# Patient Record
Sex: Female | Born: 1970 | Race: Black or African American | Hispanic: No | Marital: Married | Smoking: Former smoker
Health system: Southern US, Community
[De-identification: ages and names within clinical notes are randomized; demographics above are authoritative.]

## PROBLEM LIST (undated history)

## (undated) DIAGNOSIS — R079 Chest pain, unspecified: Secondary | ICD-10-CM

## (undated) DIAGNOSIS — U071 COVID-19: Secondary | ICD-10-CM

## (undated) DIAGNOSIS — E7800 Pure hypercholesterolemia, unspecified: Secondary | ICD-10-CM

## (undated) DIAGNOSIS — R42 Dizziness and giddiness: Secondary | ICD-10-CM

## (undated) DIAGNOSIS — D219 Benign neoplasm of connective and other soft tissue, unspecified: Secondary | ICD-10-CM

## (undated) DIAGNOSIS — F419 Anxiety disorder, unspecified: Secondary | ICD-10-CM

## (undated) HISTORY — PX: WRIST SURGERY: SHX841

## (undated) HISTORY — PX: TUBAL LIGATION: SHX77

## (undated) HISTORY — DX: COVID-19: U07.1

## (undated) HISTORY — DX: Dizziness and giddiness: R42

## (undated) HISTORY — PX: ABDOMINAL HYSTERECTOMY: SHX81

## (undated) HISTORY — DX: Chest pain, unspecified: R07.9

---

## 2007-10-08 ENCOUNTER — Emergency Department (HOSPITAL_COMMUNITY): Admission: EM | Admit: 2007-10-08 | Discharge: 2007-10-08 | Payer: Self-pay | Admitting: Emergency Medicine

## 2007-12-18 ENCOUNTER — Emergency Department (HOSPITAL_COMMUNITY): Admission: EM | Admit: 2007-12-18 | Discharge: 2007-12-18 | Payer: Self-pay | Admitting: Emergency Medicine

## 2008-03-02 ENCOUNTER — Emergency Department (HOSPITAL_COMMUNITY): Admission: EM | Admit: 2008-03-02 | Discharge: 2008-03-02 | Payer: Self-pay | Admitting: Emergency Medicine

## 2008-12-12 ENCOUNTER — Emergency Department (HOSPITAL_COMMUNITY): Admission: EM | Admit: 2008-12-12 | Discharge: 2008-12-12 | Payer: Self-pay | Admitting: Emergency Medicine

## 2009-10-18 ENCOUNTER — Emergency Department (HOSPITAL_BASED_OUTPATIENT_CLINIC_OR_DEPARTMENT_OTHER): Admission: EM | Admit: 2009-10-18 | Discharge: 2009-10-18 | Payer: Self-pay | Admitting: Emergency Medicine

## 2009-11-20 ENCOUNTER — Emergency Department (HOSPITAL_BASED_OUTPATIENT_CLINIC_OR_DEPARTMENT_OTHER): Admission: EM | Admit: 2009-11-20 | Discharge: 2009-11-20 | Payer: Self-pay | Admitting: Emergency Medicine

## 2010-01-23 ENCOUNTER — Ambulatory Visit: Payer: Self-pay | Admitting: Diagnostic Radiology

## 2010-01-23 ENCOUNTER — Emergency Department (HOSPITAL_BASED_OUTPATIENT_CLINIC_OR_DEPARTMENT_OTHER): Admission: EM | Admit: 2010-01-23 | Discharge: 2010-01-23 | Payer: Self-pay | Admitting: Emergency Medicine

## 2010-06-07 LAB — URINE MICROSCOPIC-ADD ON

## 2010-06-07 LAB — DIFFERENTIAL
Basophils Absolute: 0.2 10*3/uL — ABNORMAL HIGH (ref 0.0–0.1)
Basophils Relative: 2 % — ABNORMAL HIGH (ref 0–1)
Eosinophils Absolute: 0.1 10*3/uL (ref 0.0–0.7)
Eosinophils Relative: 1 % (ref 0–5)
Lymphs Abs: 2 10*3/uL (ref 0.7–4.0)

## 2010-06-07 LAB — BASIC METABOLIC PANEL
BUN: 10 mg/dL (ref 6–23)
CO2: 26 mEq/L (ref 19–32)
Calcium: 9.5 mg/dL (ref 8.4–10.5)
Chloride: 106 mEq/L (ref 96–112)
Creatinine, Ser: 0.6 mg/dL (ref 0.4–1.2)
Glucose, Bld: 72 mg/dL (ref 70–99)

## 2010-06-07 LAB — CBC
MCH: 27.7 pg (ref 26.0–34.0)
MCHC: 33.5 g/dL (ref 30.0–36.0)
MCV: 82.6 fL (ref 78.0–100.0)
Platelets: 283 10*3/uL (ref 150–400)
RDW: 13.8 % (ref 11.5–15.5)

## 2010-06-07 LAB — URINALYSIS, ROUTINE W REFLEX MICROSCOPIC
Bilirubin Urine: NEGATIVE
Ketones, ur: NEGATIVE mg/dL
Nitrite: NEGATIVE
Protein, ur: NEGATIVE mg/dL
pH: 7 (ref 5.0–8.0)

## 2010-06-07 LAB — URINE CULTURE
Colony Count: NO GROWTH
Culture  Setup Time: 201111010736
Culture: NO GROWTH

## 2010-06-07 LAB — PREGNANCY, URINE: Preg Test, Ur: NEGATIVE

## 2010-06-30 LAB — URINALYSIS, ROUTINE W REFLEX MICROSCOPIC
Glucose, UA: NEGATIVE mg/dL
Hgb urine dipstick: NEGATIVE
Ketones, ur: NEGATIVE mg/dL
Protein, ur: NEGATIVE mg/dL

## 2010-06-30 LAB — URINE MICROSCOPIC-ADD ON

## 2010-06-30 LAB — PREGNANCY, URINE: Preg Test, Ur: NEGATIVE

## 2010-12-22 LAB — COMPREHENSIVE METABOLIC PANEL
ALT: 14
Alkaline Phosphatase: 74
CO2: 28
GFR calc non Af Amer: >60
Glucose, Bld: 90
Potassium: 4.1
Sodium: 138

## 2010-12-22 LAB — CBC
Hemoglobin: 13.5
RBC: 4.74

## 2010-12-22 LAB — DIFFERENTIAL
Basophils Relative: 1
Eosinophils Absolute: 0.1
Monocytes Relative: 8
Neutrophils Relative %: 63

## 2010-12-22 LAB — URINALYSIS, ROUTINE W REFLEX MICROSCOPIC
Hgb urine dipstick: NEGATIVE
Specific Gravity, Urine: 1.021
Urobilinogen, UA: 1

## 2010-12-22 LAB — LIPASE, BLOOD: Lipase: 19

## 2010-12-22 LAB — WET PREP, GENITAL: Yeast Wet Prep HPF POC: NONE SEEN

## 2010-12-22 LAB — PREGNANCY, URINE: Preg Test, Ur: NEGATIVE

## 2010-12-22 LAB — GC/CHLAMYDIA PROBE AMP, GENITAL: GC Probe Amp, Genital: NEGATIVE

## 2010-12-25 LAB — URINALYSIS, ROUTINE W REFLEX MICROSCOPIC
Ketones, ur: NEGATIVE
Nitrite: NEGATIVE
Protein, ur: NEGATIVE
Urobilinogen, UA: 0.2
pH: 7

## 2010-12-25 LAB — URINE CULTURE
Colony Count: NO GROWTH
Culture: NO GROWTH

## 2010-12-25 LAB — URINE MICROSCOPIC-ADD ON

## 2010-12-25 LAB — WET PREP, GENITAL
Trich, Wet Prep: NONE SEEN
Yeast Wet Prep HPF POC: NONE SEEN

## 2010-12-29 LAB — RAPID STREP SCREEN (MED CTR MEBANE ONLY): Streptococcus, Group A Screen (Direct): NEGATIVE

## 2011-07-07 ENCOUNTER — Encounter (HOSPITAL_BASED_OUTPATIENT_CLINIC_OR_DEPARTMENT_OTHER): Payer: Self-pay | Admitting: *Deleted

## 2011-07-07 ENCOUNTER — Emergency Department (HOSPITAL_BASED_OUTPATIENT_CLINIC_OR_DEPARTMENT_OTHER)
Admission: EM | Admit: 2011-07-07 | Discharge: 2011-07-07 | Disposition: A | Discharge status: Home / Self Care | Payer: Medicaid Other | Attending: Emergency Medicine | Admitting: Emergency Medicine

## 2011-07-07 DIAGNOSIS — F17200 Nicotine dependence, unspecified, uncomplicated: Secondary | ICD-10-CM | POA: Insufficient documentation

## 2011-07-07 DIAGNOSIS — R10819 Abdominal tenderness, unspecified site: Secondary | ICD-10-CM | POA: Insufficient documentation

## 2011-07-07 DIAGNOSIS — R110 Nausea: Secondary | ICD-10-CM | POA: Insufficient documentation

## 2011-07-07 DIAGNOSIS — R109 Unspecified abdominal pain: Secondary | ICD-10-CM | POA: Insufficient documentation

## 2011-07-07 DIAGNOSIS — K219 Gastro-esophageal reflux disease without esophagitis: Secondary | ICD-10-CM | POA: Insufficient documentation

## 2011-07-07 DIAGNOSIS — E7800 Pure hypercholesterolemia, unspecified: Secondary | ICD-10-CM | POA: Insufficient documentation

## 2011-07-07 HISTORY — DX: Pure hypercholesterolemia, unspecified: E78.00

## 2011-07-07 LAB — DIFFERENTIAL
Lymphocytes Relative: 22 % (ref 12–46)
Monocytes Absolute: 0.6 10*3/uL (ref 0.1–1.0)
Monocytes Relative: 7 % (ref 3–12)
Neutro Abs: 6.3 10*3/uL (ref 1.7–7.7)

## 2011-07-07 LAB — URINALYSIS, ROUTINE W REFLEX MICROSCOPIC
Bilirubin Urine: NEGATIVE
Hgb urine dipstick: NEGATIVE
Ketones, ur: NEGATIVE mg/dL
Protein, ur: NEGATIVE mg/dL
Urobilinogen, UA: 0.2 mg/dL (ref 0.0–1.0)

## 2011-07-07 LAB — COMPREHENSIVE METABOLIC PANEL
AST: 13 U/L (ref 0–37)
Alkaline Phosphatase: 79 U/L (ref 39–117)
BUN: 7 mg/dL (ref 6–23)
CO2: 26 mEq/L (ref 19–32)
Chloride: 101 mEq/L (ref 96–112)
Creatinine, Ser: 0.6 mg/dL (ref 0.50–1.10)
GFR calc non Af Amer: >90 mL/min (ref >90)
Total Bilirubin: 0.6 mg/dL (ref 0.3–1.2)

## 2011-07-07 LAB — CBC
HCT: 39.8 % (ref 36.0–46.0)
Hemoglobin: 13.9 g/dL (ref 12.0–15.0)
RBC: 5.07 MIL/uL (ref 3.87–5.11)
WBC: 8.9 10*3/uL (ref 4.0–10.5)

## 2011-07-07 LAB — URINE MICROSCOPIC-ADD ON

## 2011-07-07 LAB — LIPASE, BLOOD: Lipase: 16 U/L (ref 11–59)

## 2011-07-07 MED ORDER — RANITIDINE HCL 150 MG PO TABS: 150.0000 mg | ORAL_TABLET | Freq: Two times a day (BID) | ORAL | Status: DC

## 2011-07-07 MED ORDER — GI COCKTAIL ~~LOC~~: 30.0000 mL | Freq: Once | ORAL | Status: DC

## 2011-07-07 MED ORDER — GI COCKTAIL ~~LOC~~
ORAL | Status: AC
  Filled 2011-07-07: qty 30

## 2011-07-07 MED ORDER — ONDANSETRON HCL 4 MG/2ML IJ SOLN
4.0000 mg | Freq: Once | INTRAMUSCULAR | Status: AC
  Administered 2011-07-07: 4 mg via INTRAVENOUS
  Filled 2011-07-07: qty 2

## 2011-07-07 MED ORDER — MORPHINE SULFATE 4 MG/ML IJ SOLN
4.0000 mg | Freq: Once | INTRAMUSCULAR | Status: AC
  Administered 2011-07-07: 4 mg via INTRAVENOUS
  Filled 2011-07-07: qty 1

## 2011-07-07 NOTE — ED Provider Notes (Signed)
History     CSN: [id number]  Arrival date & time 07/07/11  1478   First MD Initiated Contact with Patient 07/07/11 1844      Chief Complaint  Patient presents with  . Abdominal Pain    (Consider location/radiation/quality/duration/timing/severity/associated sxs/prior treatment) HPI Comments: Pt states that she has had intermittent right upper and middle abdominal pain for the last 3 days:pt states that it is a burning:pt states that the symptoms seem to be worse with laying down and eating:pt denies fever:pt denies history of similar symptoms  Patient is a 41 y.o. female presenting with abdominal pain. The history is provided by the patient. No language interpreter was used.  Abdominal Pain The primary symptoms of the illness include abdominal pain and nausea. The primary symptoms of the illness do not include vomiting, diarrhea, dysuria or vaginal discharge. The current episode started more than 2 days ago. The onset of the illness was gradual. The problem has not changed since onset. The patient states that she believes she is currently not pregnant.    Past Medical History  Diagnosis Date  . Hypercholesteremia     Past Surgical History  Procedure Date  . Tubal ligation     History reviewed. No pertinent family history.  History  Substance Use Topics  . Smoking status: Current Everyday Smoker  . Smokeless tobacco: Not on file  . Alcohol Use: No    OB History    Grav Para Term Preterm Abortions TAB SAB Ect Mult Living                  Review of Systems  Constitutional: Negative.   HENT: Negative.   Respiratory: Negative.   Cardiovascular: Negative.   Gastrointestinal: Positive for nausea and abdominal pain. Negative for vomiting and diarrhea.  Genitourinary: Negative for dysuria and vaginal discharge.  Musculoskeletal: Negative.   Skin: Negative.   Neurological: Negative.     Allergies  Ibuprofen  Home Medications   Current Outpatient Rx  Name Route  Sig Dispense Refill  . LOVASTATIN PO Oral Take 1 tablet by mouth at bedtime. Patient doesn't know milligrams of medication.  I called the pharmacy but it is closed.    Marland Kitchen SOLIFENACIN SUCCINATE 5 MG PO TABS Oral Take 5 mg by mouth daily.      BP 125/80  Pulse 74  Temp(Src) 98 F (36.7 C) (Oral)  Resp 20  Ht 5\' 7"  (1.702 m)  Wt 200 lb (90.719 kg)  BMI 31.32 kg/m2  SpO2 100%  LMP 06/24/2011  Physical Exam  Nursing note and vitals reviewed. Constitutional: She is oriented to person, place, and time. She appears well-developed and well-nourished.  HENT:  Head: Normocephalic and atraumatic.  Eyes: EOM are normal.  Neck: Neck supple.  Cardiovascular: Normal rate and regular rhythm.   Pulmonary/Chest: Effort normal.  Abdominal: Soft. Bowel sounds are normal. There is tenderness in the right upper quadrant and epigastric area.  Musculoskeletal: Normal range of motion.  Neurological: She is alert and oriented to person, place, and time.  Skin: Skin is warm and dry.  Psychiatric: She has a normal mood and affect.    ED Course  Procedures (including critical care time)  Labs Reviewed  URINALYSIS, ROUTINE W REFLEX MICROSCOPIC - Abnormal; Notable for the following:    APPearance CLOUDY (*)    Leukocytes, UA SMALL (*)    All other components within normal limits  URINE MICROSCOPIC-ADD ON - Abnormal; Notable for the following:    Squamous Epithelial /  LPF FEW (*)    Bacteria, UA MANY (*)    All other components within normal limits  PREGNANCY, URINE  CBC  DIFFERENTIAL  COMPREHENSIVE METABOLIC PANEL  LIPASE, BLOOD  URINE CULTURE   No results found.   1. GERD (gastroesophageal reflux disease)       MDM   Pt symptoms resolved with gi cocktail:considered gallstones although unlikely:will treat pt for gerd       Teressa Lower, NP 07/07/11 2241

## 2011-07-07 NOTE — ED Notes (Signed)
Pt c/o mid upper abd pain, N/V/D since Wed.

## 2011-07-07 NOTE — ED Provider Notes (Signed)
Medical screening examination/treatment/procedure(s) were performed by non-physician practitioner and as supervising physician I was immediately available for consultation/collaboration.    Abubakr Wieman L Judge Duque, MD 07/07/11 2255 

## 2011-07-07 NOTE — Discharge Instructions (Signed)
Diet for GERD or PUD  Nutrition therapy can help ease the discomfort of gastroesophageal reflux disease (GERD) and peptic ulcer disease (PUD).   HOME CARE INSTRUCTIONS    Eat your meals slowly, in a relaxed setting.   Eat 5 to 6 small meals per day.   If a food causes distress, stop eating it for a period of time.  FOODS TO AVOID   Coffee, regular or decaffeinated.   Cola beverages, regular or low calorie.   Tea, regular or decaffeinated.   Pepper.   Cocoa.   High fat foods, including meats.   Butter, margarine, hydrogenated oil (trans fats).   Peppermint or spearmint (if you have GERD).   Fruits and vegetables if not tolerated.   Alcohol.   Nicotine (smoking or chewing). This is one of the most potent stimulants to acid production in the gastrointestinal tract.   Any food that seems to aggravate your condition.  If you have questions regarding your diet, ask your caregiver or a registered dietitian.  TIPS   Lying flat may make symptoms worse. Keep the head of your bed raised 6 to 9 inches (15 to 23 cm) by using a foam wedge or blocks under the legs of the bed.   Do not lay down until 3 hours after eating a meal.   Daily physical activity may help reduce symptoms.  MAKE SURE YOU:    Understand these instructions.   Will watch your condition.   Will get help right away if you are not doing well or get worse.  Document Released: 03/12/2005 Document Revised: 03/01/2011 Document Reviewed: 01/26/2011  ExitCare Patient Information 2012 [employer]    Gastroesophageal Reflux Disease, Adult  Gastroesophageal reflux disease (GERD) happens when acid from your stomach flows up into the esophagus. When acid comes in contact with the esophagus, the acid causes soreness (inflammation) in the esophagus. Over time, GERD may create small holes (ulcers) in the lining of the esophagus.  CAUSES    Increased body weight. This puts pressure on the stomach, making acid rise from the stomach into the  esophagus.   Smoking. This increases acid production in the stomach.   Drinking alcohol. This causes decreased pressure in the lower esophageal sphincter (valve or ring of muscle between the esophagus and stomach), allowing acid from the stomach into the esophagus.   Late evening meals and a full stomach. This increases pressure and acid production in the stomach.   A malformed lower esophageal sphincter.  Sometimes, no cause is found.  SYMPTOMS    Burning pain in the lower part of the mid-chest behind the breastbone and in the mid-stomach area. This may occur twice a week or more often.   Trouble swallowing.   Sore throat.   Dry cough.   Asthma-like symptoms including chest tightness, shortness of breath, or wheezing.  DIAGNOSIS   Your caregiver may be able to diagnose GERD based on your symptoms. In some cases, X-rays and other tests may be done to check for complications or to check the condition of your stomach and esophagus.  TREATMENT   Your caregiver may recommend over-the-counter or prescription medicines to help decrease acid production. Ask your caregiver before starting or adding any new medicines.   HOME CARE INSTRUCTIONS    Change the factors that you can control. Ask your caregiver for guidance concerning weight loss, quitting smoking, and alcohol consumption.   Avoid foods and drinks that make your symptoms worse, such as:   Caffeine or   alcoholic drinks.   Chocolate.   Peppermint or mint flavorings.   Garlic and onions.   Spicy foods.   Citrus fruits, such as oranges, lemons, or limes.   Tomato-based foods such as sauce, chili, salsa, and pizza.   Fried and fatty foods.   Avoid lying down for the 3 hours prior to your bedtime or prior to taking a nap.   Eat small, frequent meals instead of large meals.   Wear loose-fitting clothing. Do not wear anything tight around your waist that causes pressure on your stomach.   Raise the head of your bed 6 to 8 inches with wood blocks to  help you sleep. Extra pillows will not help.   Only take over-the-counter or prescription medicines for pain, discomfort, or fever as directed by your caregiver.   Do not take aspirin, ibuprofen, or other nonsteroidal anti-inflammatory drugs (NSAIDs).  SEEK IMMEDIATE MEDICAL CARE IF:    You have pain in your arms, neck, jaw, teeth, or back.   Your pain increases or changes in intensity or duration.   You develop nausea, vomiting, or sweating (diaphoresis).   You develop shortness of breath, or you faint.   Your vomit is green, yellow, black, or looks like coffee grounds or blood.   Your stool is red, bloody, or black.  These symptoms could be signs of other problems, such as heart disease, gastric bleeding, or esophageal bleeding.  MAKE SURE YOU:    Understand these instructions.   Will watch your condition.   Will get help right away if you are not doing well or get worse.  Document Released: 12/20/2004 Document Revised: 03/01/2011 Document Reviewed: 09/29/2010  ExitCare Patient Information 2012 [employer].

## 2011-07-07 NOTE — ED Notes (Signed)
I assisted pt to the bathroom with IV pole.

## 2011-07-09 LAB — URINE CULTURE

## 2011-07-18 ENCOUNTER — Emergency Department (HOSPITAL_BASED_OUTPATIENT_CLINIC_OR_DEPARTMENT_OTHER)
Admission: EM | Admit: 2011-07-18 | Discharge: 2011-07-18 | Disposition: A | Discharge status: Home / Self Care | Payer: Medicaid Other | Attending: Emergency Medicine | Admitting: Emergency Medicine

## 2011-07-18 ENCOUNTER — Encounter (HOSPITAL_BASED_OUTPATIENT_CLINIC_OR_DEPARTMENT_OTHER): Payer: Self-pay | Admitting: Emergency Medicine

## 2011-07-18 DIAGNOSIS — E7800 Pure hypercholesterolemia, unspecified: Secondary | ICD-10-CM | POA: Insufficient documentation

## 2011-07-18 DIAGNOSIS — J029 Acute pharyngitis, unspecified: Secondary | ICD-10-CM | POA: Insufficient documentation

## 2011-07-18 DIAGNOSIS — J392 Other diseases of pharynx: Secondary | ICD-10-CM

## 2011-07-18 DIAGNOSIS — F17200 Nicotine dependence, unspecified, uncomplicated: Secondary | ICD-10-CM | POA: Insufficient documentation

## 2011-07-18 LAB — DIFFERENTIAL
Eosinophils Absolute: 0.1 10*3/uL (ref 0.0–0.7)
Lymphocytes Relative: 19 % (ref 12–46)
Lymphs Abs: 2.2 10*3/uL (ref 0.7–4.0)
Monocytes Relative: 8 % (ref 3–12)
Neutro Abs: 8.2 10*3/uL — ABNORMAL HIGH (ref 1.7–7.7)
Neutrophils Relative %: 71 % (ref 43–77)

## 2011-07-18 LAB — CBC
Hemoglobin: 13.6 g/dL (ref 12.0–15.0)
MCH: 27.3 pg (ref 26.0–34.0)
RBC: 4.99 MIL/uL (ref 3.87–5.11)
WBC: 11.5 10*3/uL — ABNORMAL HIGH (ref 4.0–10.5)

## 2011-07-18 LAB — MONONUCLEOSIS SCREEN: Mono Screen: NEGATIVE

## 2011-07-18 MED ORDER — LANSOPRAZOLE 30 MG PO CPDR: 30.0000 mg | DELAYED_RELEASE_CAPSULE | Freq: Every day | ORAL | Status: DC

## 2011-07-18 NOTE — ED Notes (Signed)
States she was sent to [facility] and could not be seen until may 1 states pain is continuous and feels like she has something stuck in her throat

## 2011-07-18 NOTE — Discharge Instructions (Signed)
Pain of Unknown Etiology (Pain Without a Known Cause) You have come to your caregiver because of pain. Pain can occur in any part of the body. Often there is not a definite cause. If your laboratory (blood or urine) work was normal and x-rays or other studies were normal, your caregiver may treat you without knowing the cause of the pain. An example of this is the headache. Most headaches are diagnosed by taking a history. This means your caregiver asks you questions about your headaches. Your caregiver determines a treatment based on your answers. Usually testing done for headaches is normal. Often testing is not done unless there is no response to medications. Regardless of where your pain is located today, you can be given medications to make you comfortable. If no physical cause of pain can be found, most cases of pain will gradually leave as suddenly as they came.  If you have a painful condition and no reason can be found for the pain, It is importantthat you follow up with your caregiver. If the pain becomes worse or does not go away, it may be necessary to repeat tests and look further for a possible cause.  Only take over-the-counter or prescription medicines for pain, discomfort, or fever as directed by your caregiver.   For the protection of your privacy, test results can not be given over the phone. Make sure you receive the results of your test. Ask as to how these results are to be obtained if you have not been informed. It is your responsibility to obtain your test results.   You may continue all activities unless the activities cause more pain. When the pain lessens, it is important to gradually resume normal activities. Resume activities by beginning slowly and gradually increasing the intensity and duration of the activities or exercise. During periods of severe pain, bed-rest may be helpful. Lay or sit in any position that is comfortable.   Ice used for acute (sudden) conditions may be  effective. Use a large plastic bag filled with ice and wrapped in a towel. This may provide pain relief.   See your caregiver for continued problems. They can help or refer you for exercises or physical therapy if necessary.  If you were given medications for your condition, do not drive, operate machinery or power tools, or sign legal documents for 24 hours. Do not drink alcohol, take sleeping pills, or take other medications that may interfere with treatment. See your caregiver immediately if you have pain that is becoming worse and not relieved by medications. Document Released: 12/05/2000 Document Revised: 03/01/2011 Document Reviewed: 03/12/2005 ExitCare Patient Information 2012 [employer]. 

## 2011-07-18 NOTE — ED Provider Notes (Signed)
History     CSN: [id number]  Arrival date & time 07/18/11  5361   First MD Initiated Contact with Patient 07/18/11 1953      Chief Complaint  Patient presents with  . Sore Throat    (Consider location/radiation/quality/duration/timing/severity/associated sxs/prior treatment) Patient is a 41 y.o. female presenting with pharyngitis. The history is provided by the patient. No language interpreter was used.  Sore Throat This is a new problem. The current episode started 1 to 4 weeks ago. The problem occurs constantly. The problem has been gradually worsening. Associated symptoms include a sore throat. The symptoms are aggravated by nothing. She has tried nothing for the symptoms. The treatment provided no relief.  Pt was seen here and treated with augmentin.  Pt complains of a continued sore throat.  Pt reports she was referred to Gi for evaluation for possible reflux.  Pt reports symptoms have not improved with antibiotic  Past Medical History  Diagnosis Date  . Hypercholesteremia     Past Surgical History  Procedure Date  . Tubal ligation     No family history on file.  History  Substance Use Topics  . Smoking status: Current Everyday Smoker  . Smokeless tobacco: Not on file  . Alcohol Use: No    OB History    Grav Para Term Preterm Abortions TAB SAB Ect Mult Living                  Review of Systems  HENT: Positive for sore throat.   All other systems reviewed and are negative.    Allergies  Ibuprofen  Home Medications   Current Outpatient Rx  Name Route Sig Dispense Refill  . AMOXICILLIN 875 MG PO TABS Oral Take 875 mg by mouth 2 (two) times daily.    Marland Kitchen LOVASTATIN 10 MG PO TABS Oral Take 10 mg by mouth at bedtime.    Marland Kitchen RANITIDINE HCL 150 MG PO TABS Oral Take 1 tablet (150 mg total) by mouth 2 (two) times daily. 30 tablet 0  . SOLIFENACIN SUCCINATE 5 MG PO TABS Oral Take 5 mg by mouth daily.      BP 135/76  Pulse 79  Temp(Src) 98.4 F (36.9 C) (Oral)   Resp 16  Ht 5\' 7"  (1.702 m)  Wt 192 lb (87.091 kg)  BMI 30.07 kg/m2  SpO2 100%  LMP 06/24/2011  Physical Exam  Vitals reviewed. Constitutional: She appears well-developed and well-nourished.  HENT:  Head: Normocephalic and atraumatic.  Right Ear: External ear normal.  Left Ear: External ear normal.  Nose: Nose normal.       Pharynx erythematous,  No exudate  Eyes: Conjunctivae and EOM are normal. Pupils are equal, round, and reactive to light.  Neck: Normal range of motion. Neck supple.  Cardiovascular: Normal rate.   Pulmonary/Chest: Effort normal.  Abdominal: Soft.  Musculoskeletal: Normal range of motion.  Neurological: She is alert.  Skin: Skin is warm.    ED Course  Procedures (including critical care time)  Labs Reviewed  CBC - Abnormal; Notable for the following:    WBC 11.5 (*)    All other components within normal limits  DIFFERENTIAL - Abnormal; Notable for the following:    Neutro Abs 8.2 (*)    All other components within normal limits  MONONUCLEOSIS SCREEN   No results found.   No diagnosis found.    MDM  Mono negative,  Slight elevation in wbc's.    I advised elevate bed, [place] before bed.  Pepcid         Lonia Skinner [place], [place] 07/18/11 2203

## 2011-07-18 NOTE — ED Notes (Addendum)
States for 3 days she has had a sore throat with the feeling that something is stuck in it.  Was tested for strep throat and (-) but placed on an antibiotic anyway.  Was referred to GI for esophagus eval.

## 2011-07-19 NOTE — ED Provider Notes (Signed)
Medical screening examination/treatment/procedure(s) were performed by non-physician practitioner and as supervising physician I was immediately available for consultation/collaboration.   Carleene Cooper III, MD 07/19/11 [phone]

## 2012-06-02 ENCOUNTER — Emergency Department (HOSPITAL_BASED_OUTPATIENT_CLINIC_OR_DEPARTMENT_OTHER): Payer: Medicaid Other

## 2012-06-02 ENCOUNTER — Encounter (HOSPITAL_BASED_OUTPATIENT_CLINIC_OR_DEPARTMENT_OTHER): Payer: Self-pay

## 2012-06-02 ENCOUNTER — Emergency Department (HOSPITAL_BASED_OUTPATIENT_CLINIC_OR_DEPARTMENT_OTHER)
Admission: EM | Admit: 2012-06-02 | Discharge: 2012-06-02 | Disposition: A | Discharge status: Home / Self Care | Payer: Medicaid Other | Attending: Emergency Medicine | Admitting: Emergency Medicine

## 2012-06-02 DIAGNOSIS — G8911 Acute pain due to trauma: Secondary | ICD-10-CM | POA: Insufficient documentation

## 2012-06-02 DIAGNOSIS — M25531 Pain in right wrist: Secondary | ICD-10-CM

## 2012-06-02 DIAGNOSIS — Z79899 Other long term (current) drug therapy: Secondary | ICD-10-CM | POA: Insufficient documentation

## 2012-06-02 DIAGNOSIS — Z87891 Personal history of nicotine dependence: Secondary | ICD-10-CM | POA: Insufficient documentation

## 2012-06-02 DIAGNOSIS — M779 Enthesopathy, unspecified: Secondary | ICD-10-CM

## 2012-06-02 DIAGNOSIS — M65839 Other synovitis and tenosynovitis, unspecified forearm: Secondary | ICD-10-CM | POA: Insufficient documentation

## 2012-06-02 DIAGNOSIS — E7800 Pure hypercholesterolemia, unspecified: Secondary | ICD-10-CM | POA: Insufficient documentation

## 2012-06-02 DIAGNOSIS — M65849 Other synovitis and tenosynovitis, unspecified hand: Secondary | ICD-10-CM | POA: Insufficient documentation

## 2012-06-02 DIAGNOSIS — M25539 Pain in unspecified wrist: Secondary | ICD-10-CM | POA: Insufficient documentation

## 2012-06-02 MED ORDER — IBUPROFEN 600 MG PO TABS: 600.0000 mg | ORAL_TABLET | Freq: Three times a day (TID) | ORAL | Status: DC | PRN

## 2012-06-02 MED ORDER — IBUPROFEN 400 MG PO TABS
600.0000 mg | ORAL_TABLET | Freq: Once | ORAL | Status: AC
  Administered 2012-06-02: 600 mg via ORAL
  Filled 2012-06-02: qty 1

## 2012-06-02 NOTE — ED Provider Notes (Addendum)
History     CSN: [id number]  Arrival date & time 06/02/12  1506   First MD Initiated Contact with Patient 06/02/12 1611      Chief Complaint  Patient presents with  . Wrist Injury    (Consider location/radiation/quality/duration/timing/severity/associated sxs/prior treatment) Patient is a 42 y.o. female presenting with wrist injury. The history is provided by the patient.  Wrist Injury Associated symptoms: no fever   pt c/o right wrist pain for the past 3 months. States at onset, was slinging/lifting/moving heavy totes at work. Felt pull/pain to radial aspect right wrist/base of thumb. Pain constant since. States exacerbated by certain movements. No abrupt change or worsening today. No fever or chills. No skin changes or redness. No numbness/weakness. Right hand dominant.     Past Medical History  Diagnosis Date  . Hypercholesteremia     Past Surgical History  Procedure Laterality Date  . Tubal ligation      No family history on file.  History  Substance Use Topics  . Smoking status: Former Games developer  . Smokeless tobacco: Not on file  . Alcohol Use: No    OB History   Grav Para Term Preterm Abortions TAB SAB Ect Mult Living                  Review of Systems  Constitutional: Negative for fever and chills.  Skin: Negative for rash and wound.  Neurological: Negative for weakness and numbness.    Allergies  Review of patient's allergies indicates no known allergies.  Home Medications   Current Outpatient Rx  Name  Route  Sig  Dispense  Refill  . amoxicillin (AMOXIL) 875 MG tablet   Oral   Take 875 mg by mouth 2 (two) times daily.         . lansoprazole (PREVACID) 30 MG capsule   Oral   Take 1 capsule (30 mg total) by mouth daily.   30 capsule   0   . lovastatin (MEVACOR) 10 MG tablet   Oral   Take 10 mg by mouth at bedtime.         . ranitidine (ZANTAC) 150 MG tablet   Oral   Take 1 tablet (150 mg total) by mouth 2 (two) times daily.   30  tablet   0   . solifenacin (VESICARE) 5 MG tablet   Oral   Take 5 mg by mouth daily.           BP 133/83  Pulse 97  Temp(Src) 98.9 F (37.2 C) (Oral)  Resp 18  Ht 5\' 7"  (1.702 m)  Wt 182 lb (82.555 kg)  BMI 28.5 kg/m2  SpO2 98%  LMP 05/14/2012  Physical Exam  Nursing note and vitals reviewed. Constitutional: She appears well-developed and well-nourished. No distress.  Eyes: Conjunctivae are normal.  Neck: Neck supple. No tracheal deviation present.  Cardiovascular: Normal rate.   Pulmonary/Chest: Effort normal. No respiratory distress.  Abdominal: Normal appearance.  Musculoskeletal: She exhibits no edema.  Left wrist tenderness diffusely esp radial aspect. No focal scaphoid tenderness. Skin intact. No erythema. Radial pusle 2+. Normal cap refill distally. Pain w active movement/extension thumb. Full rom, flexion and extension of thumb. No gross ligament laxity noted.  ?tendonitis.   Neurological: She is alert.  Skin: Skin is warm and dry. No rash noted.  Psychiatric: She has a normal mood and affect.    ED Course  Procedures (including critical care time)  Labs Reviewed - No data to display Dg  Wrist Complete Right  06/02/2012  *RADIOLOGY REPORT*  Clinical Data: Injury, radial wrist pain, swelling  RIGHT WRIST - COMPLETE 3+ VIEW  Comparison: None.  Findings: Normal alignment without fracture.  Distal radius, ulna and carpal bones are intact.  Preserved joint spaces.  No soft tissue abnormality.  IMPRESSION: No acute finding.   Original Report Authenticated By: Judie Petit. Miles Costain, M.D.        MDM  Motrin po.  Right thumb spica splint applied by staff.  Discussed hand f/u given persistent/recurrent pain for past few months.  Reviewed nursing notes and prior charts for additional history.           Suzi Roots, MD 06/02/12 1610  Suzi Roots, MD 06/02/12 [phone]

## 2012-06-02 NOTE — ED Notes (Signed)
Right wrist WC injury Dec 2013-did receive medical treatment-c/o cont'd pain

## 2012-08-06 ENCOUNTER — Emergency Department (HOSPITAL_BASED_OUTPATIENT_CLINIC_OR_DEPARTMENT_OTHER)
Admission: EM | Admit: 2012-08-06 | Discharge: 2012-08-06 | Disposition: A | Discharge status: Home / Self Care | Payer: Medicaid Other | Attending: Emergency Medicine | Admitting: Emergency Medicine

## 2012-08-06 ENCOUNTER — Emergency Department (HOSPITAL_BASED_OUTPATIENT_CLINIC_OR_DEPARTMENT_OTHER): Payer: Medicaid Other

## 2012-08-06 ENCOUNTER — Encounter (HOSPITAL_BASED_OUTPATIENT_CLINIC_OR_DEPARTMENT_OTHER): Payer: Self-pay | Admitting: *Deleted

## 2012-08-06 DIAGNOSIS — E7800 Pure hypercholesterolemia, unspecified: Secondary | ICD-10-CM | POA: Insufficient documentation

## 2012-08-06 DIAGNOSIS — Z79899 Other long term (current) drug therapy: Secondary | ICD-10-CM | POA: Insufficient documentation

## 2012-08-06 DIAGNOSIS — Z87891 Personal history of nicotine dependence: Secondary | ICD-10-CM | POA: Insufficient documentation

## 2012-08-06 DIAGNOSIS — M65839 Other synovitis and tenosynovitis, unspecified forearm: Secondary | ICD-10-CM | POA: Insufficient documentation

## 2012-08-06 DIAGNOSIS — G8929 Other chronic pain: Secondary | ICD-10-CM | POA: Insufficient documentation

## 2012-08-06 DIAGNOSIS — M779 Enthesopathy, unspecified: Secondary | ICD-10-CM

## 2012-08-06 MED ORDER — IBUPROFEN 800 MG PO TABS
800.0000 mg | ORAL_TABLET | Freq: Once | ORAL | Status: AC
  Administered 2012-08-06: 800 mg via ORAL

## 2012-08-06 MED ORDER — IBUPROFEN 800 MG PO TABS: 800.0000 mg | ORAL_TABLET | Freq: Three times a day (TID) | ORAL | Status: DC

## 2012-08-06 NOTE — ED Notes (Signed)
MD at bedside. 

## 2012-08-06 NOTE — ED Provider Notes (Signed)
History     CSN: [id number]  Arrival date & time 08/06/12  0118   None     Chief Complaint  Patient presents with  . Wrist Pain    (Consider location/radiation/quality/duration/timing/severity/associated sxs/prior treatment) Patient is a 42 y.o. female presenting with wrist pain.  Wrist Pain This is a chronic problem. The current episode started more than 1 week ago (1 year). The problem occurs constantly. The problem has been gradually worsening. Pertinent negatives include no abdominal pain. Nothing aggravates the symptoms. Nothing relieves the symptoms. She has tried nothing for the symptoms. The treatment provided no relief.  No trauma.    Past Medical History  Diagnosis Date  . Hypercholesteremia     Past Surgical History  Procedure Laterality Date  . Tubal ligation      No family history on file.  History  Substance Use Topics  . Smoking status: Former Games developer  . Smokeless tobacco: Not on file  . Alcohol Use: No    OB History   Grav Para Term Preterm Abortions TAB SAB Ect Mult Living                  Review of Systems  Gastrointestinal: Negative for abdominal pain.  All other systems reviewed and are negative.    Allergies  Review of patient's allergies indicates no known allergies.  Home Medications   Current Outpatient Rx  Name  Route  Sig  Dispense  Refill  . amoxicillin (AMOXIL) 875 MG tablet   Oral   Take 875 mg by mouth 2 (two) times daily.         Marland Kitchen ibuprofen (ADVIL,MOTRIN) 600 MG tablet   Oral   Take 1 tablet (600 mg total) by mouth every 8 (eight) hours as needed for pain. Take with food.   20 tablet   0   . ibuprofen (ADVIL,MOTRIN) 800 MG tablet   Oral   Take 1 tablet (800 mg total) by mouth 3 (three) times daily.   21 tablet   0   . EXPIRED: lansoprazole (PREVACID) 30 MG capsule   Oral   Take 1 capsule (30 mg total) by mouth daily.   30 capsule   0   . lovastatin (MEVACOR) 10 MG tablet   Oral   Take 10 mg by mouth at  bedtime.         Marland Kitchen EXPIRED: ranitidine (ZANTAC) 150 MG tablet   Oral   Take 1 tablet (150 mg total) by mouth 2 (two) times daily.   30 tablet   0   . solifenacin (VESICARE) 5 MG tablet   Oral   Take 5 mg by mouth daily.           BP 133/78  Pulse 68  Temp(Src) 98.2 F (36.8 C) (Oral)  Resp 16  SpO2 98%  LMP 08/04/2012  Physical Exam  Constitutional: She is oriented to person, place, and time. She appears well-developed and well-nourished. No distress.  HENT:  Head: Normocephalic and atraumatic.  Eyes: Conjunctivae are normal. Pupils are equal, round, and reactive to light.  Neck: Normal range of motion. Neck supple.  Cardiovascular: Normal rate, regular rhythm and intact distal pulses.   Pulmonary/Chest: Effort normal and breath sounds normal. She has no wheezes.  Abdominal: Soft. Bowel sounds are normal. There is no tenderness.  Musculoskeletal: Normal range of motion. She exhibits no edema.  FROM of the right wrist.  Right hand neurovascularly intact cap refill < 2 sec no snuff box tenderness  Neurological: She is alert and oriented to person, place, and time. She has normal reflexes.  Skin: Skin is warm and dry.  Psychiatric: She has a normal mood and affect.    ED Course  Procedures (including critical care time)  Labs Reviewed - No data to display No results found.   1. Tendonitis       MDM  Ice elevation and NSAIDS and wrist splint and follow up with orthopedics for ongoing care        Kazuko Clemence K Lucette Kratz-Rasch, MD 08/06/12 [phone]

## 2012-08-06 NOTE — ED Notes (Signed)
C/o right wrist pain off and on x 1 year and was dx'd with tendonitis. Pain worse past few days, no known injury.

## 2012-10-14 ENCOUNTER — Emergency Department (HOSPITAL_BASED_OUTPATIENT_CLINIC_OR_DEPARTMENT_OTHER)
Admission: EM | Admit: 2012-10-14 | Discharge: 2012-10-14 | Disposition: A | Discharge status: Home / Self Care | Payer: Medicaid Other | Attending: Emergency Medicine | Admitting: Emergency Medicine

## 2012-10-14 ENCOUNTER — Encounter (HOSPITAL_BASED_OUTPATIENT_CLINIC_OR_DEPARTMENT_OTHER): Payer: Self-pay | Admitting: *Deleted

## 2012-10-14 DIAGNOSIS — Z87891 Personal history of nicotine dependence: Secondary | ICD-10-CM | POA: Insufficient documentation

## 2012-10-14 DIAGNOSIS — Z792 Long term (current) use of antibiotics: Secondary | ICD-10-CM | POA: Insufficient documentation

## 2012-10-14 DIAGNOSIS — E7800 Pure hypercholesterolemia, unspecified: Secondary | ICD-10-CM | POA: Insufficient documentation

## 2012-10-14 DIAGNOSIS — Z79899 Other long term (current) drug therapy: Secondary | ICD-10-CM | POA: Insufficient documentation

## 2012-10-14 DIAGNOSIS — J029 Acute pharyngitis, unspecified: Secondary | ICD-10-CM

## 2012-10-14 MED ORDER — PREDNISONE 50 MG PO TABS
60.0000 mg | ORAL_TABLET | Freq: Once | ORAL | Status: AC
  Administered 2012-10-14: 60 mg via ORAL
  Filled 2012-10-14: qty 1

## 2012-10-14 NOTE — ED Provider Notes (Signed)
History    CSN: [id number] Arrival date & time 10/14/12  0127  First MD Initiated Contact with Patient 10/14/12 0147     Chief Complaint  Patient presents with  . Sore Throat    Patient is a 42 y.o. female presenting with pharyngitis. The history is provided by the patient.  Sore Throat This is a new problem. The current episode started more than 2 days ago. The problem occurs daily. The problem has been gradually worsening. Pertinent negatives include no chest pain and no shortness of breath. The symptoms are aggravated by swallowing. The symptoms are relieved by rest.   Past Medical History  Diagnosis Date  . Hypercholesteremia    Past Surgical History  Procedure Laterality Date  . Tubal ligation     History reviewed. No pertinent family history. History  Substance Use Topics  . Smoking status: Former Games developer  . Smokeless tobacco: Not on file  . Alcohol Use: No   OB History   Grav Para Term Preterm Abortions TAB SAB Ect Mult Living                 Review of Systems  Constitutional: Negative for fever.  Respiratory: Positive for cough. Negative for shortness of breath.   Cardiovascular: Negative for chest pain.    Allergies  Review of patient's allergies indicates no known allergies.  Home Medications   Current Outpatient Rx  Name  Route  Sig  Dispense  Refill  . amoxicillin (AMOXIL) 875 MG tablet   Oral   Take 875 mg by mouth 2 (two) times daily.         Marland Kitchen ibuprofen (ADVIL,MOTRIN) 600 MG tablet   Oral   Take 1 tablet (600 mg total) by mouth every 8 (eight) hours as needed for pain. Take with food.   20 tablet   0   . ibuprofen (ADVIL,MOTRIN) 800 MG tablet   Oral   Take 1 tablet (800 mg total) by mouth 3 (three) times daily.   21 tablet   0   . EXPIRED: lansoprazole (PREVACID) 30 MG capsule   Oral   Take 1 capsule (30 mg total) by mouth daily.   30 capsule   0   . lovastatin (MEVACOR) 10 MG tablet   Oral   Take 10 mg by mouth at bedtime.          Marland Kitchen EXPIRED: ranitidine (ZANTAC) 150 MG tablet   Oral   Take 1 tablet (150 mg total) by mouth 2 (two) times daily.   30 tablet   0   . solifenacin (VESICARE) 5 MG tablet   Oral   Take 5 mg by mouth daily.          BP 132/84  Pulse 76  Temp(Src) 98.5 F (36.9 C) (Oral)  Resp 17  Ht 5\' 7"  (1.702 m)  Wt 185 lb (83.915 kg)  BMI 28.97 kg/m2  SpO2 100% Physical Exam CONSTITUTIONAL: Well developed/well nourished HEAD: Normocephalic/atraumatic EYES: EOM ENMT: Mucous membranes moist, uvula midline, no pharyngeal exudates, but she does have significant pharyngeal erythema. Normal phonation.  No stridor or drooling NECK: supple no meningeal signs SPINE:entire spine nontender CV: S1/S2 noted, no murmurs/rubs/gallops noted LUNGS: Lungs are clear to auscultation bilaterally, no apparent distress ABDOMEN: soft, nontender, no rebound or guarding NEURO: Pt is awake/alert, moves all extremitiesx4 EXTREMITIES: pulses normal, full ROM SKIN: warm, color normal PSYCH: no abnormalities of mood noted  ED Course  Procedures  1. Pharyngitis  MDM  Nursing notes including past medical history and social history reviewed and considered in documentation  Pt with cough and sore throat.  Suspect viral pharyngitis.  One dose of steroids offered to help with pain relief   Joya Gaskins, MD 10/14/12 [phone]

## 2012-10-14 NOTE — ED Notes (Signed)
Sore throat and cough since Friday, denies fever or chills

## 2013-02-10 ENCOUNTER — Emergency Department (HOSPITAL_BASED_OUTPATIENT_CLINIC_OR_DEPARTMENT_OTHER): Payer: Medicaid Other

## 2013-02-10 ENCOUNTER — Emergency Department (HOSPITAL_BASED_OUTPATIENT_CLINIC_OR_DEPARTMENT_OTHER)
Admission: EM | Admit: 2013-02-10 | Discharge: 2013-02-10 | Disposition: A | Discharge status: Home / Self Care | Payer: Medicaid Other | Attending: Emergency Medicine | Admitting: Emergency Medicine

## 2013-02-10 ENCOUNTER — Encounter (HOSPITAL_BASED_OUTPATIENT_CLINIC_OR_DEPARTMENT_OTHER): Payer: Self-pay | Admitting: Emergency Medicine

## 2013-02-10 DIAGNOSIS — X500XXA Overexertion from strenuous movement or load, initial encounter: Secondary | ICD-10-CM | POA: Insufficient documentation

## 2013-02-10 DIAGNOSIS — Y9289 Other specified places as the place of occurrence of the external cause: Secondary | ICD-10-CM | POA: Insufficient documentation

## 2013-02-10 DIAGNOSIS — Y9389 Activity, other specified: Secondary | ICD-10-CM | POA: Insufficient documentation

## 2013-02-10 DIAGNOSIS — Z79899 Other long term (current) drug therapy: Secondary | ICD-10-CM | POA: Insufficient documentation

## 2013-02-10 DIAGNOSIS — E7800 Pure hypercholesterolemia, unspecified: Secondary | ICD-10-CM | POA: Insufficient documentation

## 2013-02-10 DIAGNOSIS — Z87891 Personal history of nicotine dependence: Secondary | ICD-10-CM | POA: Insufficient documentation

## 2013-02-10 DIAGNOSIS — IMO0002 Reserved for concepts with insufficient information to code with codable children: Secondary | ICD-10-CM | POA: Insufficient documentation

## 2013-02-10 DIAGNOSIS — S39012A Strain of muscle, fascia and tendon of lower back, initial encounter: Secondary | ICD-10-CM

## 2013-02-10 DIAGNOSIS — Z792 Long term (current) use of antibiotics: Secondary | ICD-10-CM | POA: Insufficient documentation

## 2013-02-10 MED ORDER — MORPHINE SULFATE 4 MG/ML IJ SOLN
8.0000 mg | Freq: Once | INTRAMUSCULAR | Status: DC
  Filled 2013-02-10 (×2): qty 2

## 2013-02-10 MED ORDER — MORPHINE SULFATE 4 MG/ML IJ SOLN
8.0000 mg | Freq: Once | INTRAMUSCULAR | Status: AC
  Administered 2013-02-10: 8 mg via INTRAMUSCULAR

## 2013-02-10 MED ORDER — ONDANSETRON 4 MG PO TBDP
4.0000 mg | ORAL_TABLET | Freq: Once | ORAL | Status: AC
  Administered 2013-02-10: 4 mg via ORAL
  Filled 2013-02-10: qty 1

## 2013-02-10 MED ORDER — HYDROCODONE-ACETAMINOPHEN 5-325 MG PO TABS: 2.0000 | ORAL_TABLET | ORAL | Status: DC | PRN

## 2013-02-10 MED ORDER — DEXAMETHASONE SODIUM PHOSPHATE 10 MG/ML IJ SOLN
10.0000 mg | Freq: Once | INTRAMUSCULAR | Status: AC
  Administered 2013-02-10: 10 mg via INTRAMUSCULAR
  Filled 2013-02-10: qty 1

## 2013-02-10 MED ORDER — CYCLOBENZAPRINE HCL 10 MG PO TABS: 10.0000 mg | ORAL_TABLET | Freq: Two times a day (BID) | ORAL | Status: DC | PRN

## 2013-02-10 NOTE — ED Notes (Signed)
Low back pain onset Sunday night states feels like pressure and pain radiates to right hip
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
## 2013-02-10 NOTE — ED Notes (Addendum)
Patient transported to X-ray 

## 2013-06-10 ENCOUNTER — Encounter (HOSPITAL_BASED_OUTPATIENT_CLINIC_OR_DEPARTMENT_OTHER): Payer: Self-pay | Admitting: Emergency Medicine

## 2013-06-10 ENCOUNTER — Emergency Department (HOSPITAL_BASED_OUTPATIENT_CLINIC_OR_DEPARTMENT_OTHER)
Admission: EM | Admit: 2013-06-10 | Discharge: 2013-06-11 | Disposition: A | Discharge status: Home / Self Care | Payer: Medicaid Other | Attending: Emergency Medicine | Admitting: Emergency Medicine

## 2013-06-10 DIAGNOSIS — Z862 Personal history of diseases of the blood and blood-forming organs and certain disorders involving the immune mechanism: Secondary | ICD-10-CM | POA: Insufficient documentation

## 2013-06-10 DIAGNOSIS — W268XXA Contact with other sharp object(s), not elsewhere classified, initial encounter: Secondary | ICD-10-CM | POA: Insufficient documentation

## 2013-06-10 DIAGNOSIS — Z87891 Personal history of nicotine dependence: Secondary | ICD-10-CM | POA: Insufficient documentation

## 2013-06-10 DIAGNOSIS — Y929 Unspecified place or not applicable: Secondary | ICD-10-CM | POA: Insufficient documentation

## 2013-06-10 DIAGNOSIS — J3489 Other specified disorders of nose and nasal sinuses: Secondary | ICD-10-CM | POA: Insufficient documentation

## 2013-06-10 DIAGNOSIS — Z8639 Personal history of other endocrine, nutritional and metabolic disease: Secondary | ICD-10-CM | POA: Insufficient documentation

## 2013-06-10 DIAGNOSIS — R0981 Nasal congestion: Secondary | ICD-10-CM

## 2013-06-10 DIAGNOSIS — T148XXA Other injury of unspecified body region, initial encounter: Secondary | ICD-10-CM

## 2013-06-10 DIAGNOSIS — S91309A Unspecified open wound, unspecified foot, initial encounter: Secondary | ICD-10-CM | POA: Insufficient documentation

## 2013-06-10 DIAGNOSIS — Z3202 Encounter for pregnancy test, result negative: Secondary | ICD-10-CM | POA: Insufficient documentation

## 2013-06-10 DIAGNOSIS — Y939 Activity, unspecified: Secondary | ICD-10-CM | POA: Insufficient documentation

## 2013-06-10 NOTE — ED Notes (Signed)
Pt reports sinus infection x 1 week.  Reports that she also stepped on a nail last night with her (R) foot. Tetanus shot this year.

## 2013-06-11 LAB — PREGNANCY, URINE: PREG TEST UR: NEGATIVE

## 2013-06-11 MED ORDER — CIPROFLOXACIN HCL 500 MG PO TABS
500.0000 mg | ORAL_TABLET | Freq: Once | ORAL | Status: AC
  Administered 2013-06-11: 500 mg via ORAL
  Filled 2013-06-11: qty 1

## 2013-06-11 MED ORDER — CIPROFLOXACIN HCL 500 MG PO TABS: 500.0000 mg | ORAL_TABLET | Freq: Two times a day (BID) | ORAL | Status: DC

## 2013-06-11 MED ORDER — FLUTICASONE PROPIONATE 50 MCG/ACT NA SUSP: 2.0000 | Freq: Every day | NASAL | Status: DC

## 2013-06-11 MED ORDER — TETANUS-DIPHTH-ACELL PERTUSSIS 5-2.5-18.5 LF-MCG/0.5 IM SUSP: 0.5000 mL | Freq: Once | INTRAMUSCULAR | Status: DC

## 2013-06-11 NOTE — ED Notes (Signed)
MD at bedside. 

## 2013-06-11 NOTE — ED Provider Notes (Signed)
CSN: [id number]     Arrival date & time 06/10/13  2237 History   First MD Initiated Contact with Patient 06/11/13 0009     Chief Complaint  Patient presents with  . Recurrent Sinusitis     (Consider location/radiation/quality/duration/timing/severity/associated sxs/prior Treatment) Patient is a 43 y.o. female presenting with foot injury. The history is provided by the patient.  Foot Injury Location:  Foot Time since incident:  1 day Injury: yes   Mechanism of injury comment:  Puncture with nail Foot location:  R foot Pain details:    Quality:  Aching   Radiates to:  Does not radiate   Severity:  Mild   Onset quality:  Sudden   Duration:  1 day   Timing:  Constant   Progression:  Unchanged Chronicity:  New Dislocation: no   Foreign body present:  No foreign bodies Tetanus status:  Up to date Prior injury to area:  No Relieved by:  Nothing Worsened by:  Nothing tried Ineffective treatments:  None tried Associated symptoms: no back pain   Associated symptoms comment:  Also 1 week of nasal congestion Risk factors: no obesity     Past Medical History  Diagnosis Date  . Hypercholesteremia    Past Surgical History  Procedure Laterality Date  . Tubal ligation     History reviewed. No pertinent family history. History  Substance Use Topics  . Smoking status: Former Research scientist (life sciences)  . Smokeless tobacco: Not on file  . Alcohol Use: No   OB History   Grav Para Term Preterm Abortions TAB SAB Ect Mult Living                 Review of Systems  Musculoskeletal: Negative for back pain.  All other systems reviewed and are negative.      Allergies  Review of patient's allergies indicates no known allergies.  Home Medications   Current Outpatient Rx  Name  Route  Sig  Dispense  Refill  . EXPIRED: lansoprazole (PREVACID) 30 MG capsule   Oral   Take 1 capsule (30 mg total) by mouth daily.   30 capsule   0   . EXPIRED: ranitidine (ZANTAC) 150 MG tablet   Oral   Take 1  tablet (150 mg total) by mouth 2 (two) times daily.   30 tablet   0    BP 123/72  Pulse 66  Temp(Src) 98.7 F (37.1 C) (Oral)  Resp 20  Ht 5\' 7"  (1.702 m)  Wt 183 lb (83.008 kg)  BMI 28.66 kg/m2  SpO2 100%  LMP 05/20/2013 Physical Exam  Constitutional: She is oriented to person, place, and time. She appears well-developed and well-nourished. No distress.  HENT:  Head: Normocephalic and atraumatic.  Right Ear: External ear normal.  Left Ear: External ear normal.  Mouth/Throat: Oropharynx is clear and moist. No oropharyngeal exudate.  Eyes: Conjunctivae and EOM are normal. Pupils are equal, round, and reactive to light.  Neck: Normal range of motion. Neck supple. No tracheal deviation present.  Cardiovascular: Normal rate, regular rhythm and intact distal pulses.   Pulmonary/Chest: Effort normal and breath sounds normal. She has no wheezes. She has no rales.  Abdominal: Soft. Bowel sounds are normal. There is no tenderness. There is no rebound and no guarding.  Musculoskeletal: Normal range of motion.       Right foot: She exhibits normal range of motion, no tenderness, no bony tenderness, no swelling, normal capillary refill, no crepitus and no deformity.  5th MTp right  foot small puncture  Lymphadenopathy:    She has no cervical adenopathy.  Neurological: She is alert and oriented to person, place, and time.  Skin: Skin is warm and dry.  Psychiatric: She has a normal mood and affect.    ED Course  Procedures (including critical care time) Labs Review Labs Reviewed  PREGNANCY, URINE   Imaging Review No results found.   EKG Interpretation None      MDM   Final diagnoses:  None    Puncture wound to the foot, soak BID in Dial soap.  Tetanus is up to date will prescribe cipro x 7 days    Dhanvi Boesen K Dennette Faulconer-Rasch, MD 06/11/13 0110

## 2013-07-21 ENCOUNTER — Emergency Department (HOSPITAL_COMMUNITY): Payer: Medicaid Other

## 2013-07-21 ENCOUNTER — Encounter (HOSPITAL_COMMUNITY): Payer: Self-pay | Admitting: Emergency Medicine

## 2013-07-21 ENCOUNTER — Emergency Department (HOSPITAL_COMMUNITY)
Admission: EM | Admit: 2013-07-21 | Discharge: 2013-07-22 | Disposition: A | Discharge status: Home / Self Care | Payer: Medicaid Other | Attending: Emergency Medicine | Admitting: Emergency Medicine

## 2013-07-21 DIAGNOSIS — R55 Syncope and collapse: Secondary | ICD-10-CM

## 2013-07-21 DIAGNOSIS — Z87891 Personal history of nicotine dependence: Secondary | ICD-10-CM | POA: Insufficient documentation

## 2013-07-21 DIAGNOSIS — E7800 Pure hypercholesterolemia, unspecified: Secondary | ICD-10-CM | POA: Insufficient documentation

## 2013-07-21 DIAGNOSIS — N390 Urinary tract infection, site not specified: Secondary | ICD-10-CM

## 2013-07-21 DIAGNOSIS — Z79899 Other long term (current) drug therapy: Secondary | ICD-10-CM | POA: Insufficient documentation

## 2013-07-21 LAB — URINALYSIS, ROUTINE W REFLEX MICROSCOPIC
Bilirubin Urine: NEGATIVE
GLUCOSE, UA: NEGATIVE mg/dL
Ketones, ur: NEGATIVE mg/dL
Nitrite: NEGATIVE
PROTEIN: NEGATIVE mg/dL
Specific Gravity, Urine: 1.011 (ref 1.005–1.030)
Urobilinogen, UA: 0.2 mg/dL (ref 0.0–1.0)
pH: 6 (ref 5.0–8.0)

## 2013-07-21 LAB — CBC WITH DIFFERENTIAL/PLATELET
Basophils Absolute: 0 10*3/uL (ref 0.0–0.1)
Basophils Relative: 0 % (ref 0–1)
EOS ABS: 0.1 10*3/uL (ref 0.0–0.7)
Eosinophils Relative: 1 % (ref 0–5)
HCT: 43.1 % (ref 36.0–46.0)
Hemoglobin: 14.6 g/dL (ref 12.0–15.0)
LYMPHS ABS: 1.8 10*3/uL (ref 0.7–4.0)
LYMPHS PCT: 19 % (ref 12–46)
MCH: 27.8 pg (ref 26.0–34.0)
MCHC: 33.9 g/dL (ref 30.0–36.0)
MCV: 81.9 fL (ref 78.0–100.0)
Monocytes Absolute: 0.7 10*3/uL (ref 0.1–1.0)
Monocytes Relative: 7 % (ref 3–12)
NEUTROS ABS: 6.8 10*3/uL (ref 1.7–7.7)
NEUTROS PCT: 73 % (ref 43–77)
PLATELETS: 255 10*3/uL (ref 150–400)
RBC: 5.26 MIL/uL — AB (ref 3.87–5.11)
RDW: 14.4 % (ref 11.5–15.5)
WBC: 9.5 10*3/uL (ref 4.0–10.5)

## 2013-07-21 LAB — BASIC METABOLIC PANEL
BUN: 10 mg/dL (ref 6–23)
CO2: 19 meq/L (ref 19–32)
Calcium: 9 mg/dL (ref 8.4–10.5)
Chloride: 103 mEq/L (ref 96–112)
Creatinine, Ser: 0.57 mg/dL (ref 0.50–1.10)
GFR calc Af Amer: >90 mL/min (ref >90)
GLUCOSE: 78 mg/dL (ref 70–99)
POTASSIUM: 3.8 meq/L (ref 3.7–5.3)
SODIUM: 137 meq/L (ref 137–147)

## 2013-07-21 LAB — URINE MICROSCOPIC-ADD ON

## 2013-07-21 LAB — TROPONIN I: Troponin I: <0.3 ng/mL (ref <0.30)

## 2013-07-21 MED ORDER — DIPHENHYDRAMINE HCL 50 MG/ML IJ SOLN
25.0000 mg | Freq: Once | INTRAMUSCULAR | Status: AC
Start: 2013-07-21 — End: 2013-07-21
  Administered 2013-07-21: 25 mg via INTRAVENOUS
  Filled 2013-07-21: qty 1

## 2013-07-21 MED ORDER — DEXTROSE 5 % IV SOLN
1.0000 g | Freq: Once | INTRAVENOUS | Status: AC
  Administered 2013-07-21: 1 g via INTRAVENOUS
  Filled 2013-07-21: qty 10

## 2013-07-21 MED ORDER — METOCLOPRAMIDE HCL 5 MG/ML IJ SOLN
10.0000 mg | Freq: Once | INTRAMUSCULAR | Status: AC
  Administered 2013-07-21: 10 mg via INTRAVENOUS
  Filled 2013-07-21: qty 2

## 2013-07-21 MED ORDER — SODIUM CHLORIDE 0.9 % IV BOLUS (SEPSIS)
1000.0000 mL | Freq: Once | INTRAVENOUS | Status: AC
  Administered 2013-07-21: 1000 mL via INTRAVENOUS

## 2013-07-21 MED ORDER — SODIUM CHLORIDE 0.9 % IV SOLN
INTRAVENOUS | Status: DC
  Administered 2013-07-21: 23:00:00 via INTRAVENOUS

## 2013-07-21 MED ORDER — SODIUM CHLORIDE 0.9 % IV BOLUS (SEPSIS): 1000.0000 mL | Freq: Once | INTRAVENOUS | Status: DC

## 2013-07-21 NOTE — ED Provider Notes (Signed)
CSN: [id number]     Arrival date & time 07/21/13  2039 History   First MD Initiated Contact with Patient 07/21/13 2133     Chief Complaint  Patient presents with  . Near Syncope  . Weakness     (Consider location/radiation/quality/duration/timing/severity/associated sxs/prior Treatment) HPI  Jasmine Weber is a 43 y.o. female complaining of headache and feeling nauseous with a near syncopal event earlier in the afternoon. Patient states that she has been very thirsty and urinating frequently. She has family history of diabetes. Patient denies any chest pain, palpitations, nausea vomiting, fever, decreased by mouth intake. States that she normally has physical every year but has not gone in the last year because she does not have insurance. Pt denies fever, cough, h/o DVT/PE, calf pain or leg swelling, hemoptysis, recent immobilization, cancer/chemotherapy in the last 6 months, exogenous estrogen.   Past Medical History  Diagnosis Date  . Hypercholesteremia    Past Surgical History  Procedure Laterality Date  . Tubal ligation     No family history on file. History  Substance Use Topics  . Smoking status: Former Research scientist (life sciences)  . Smokeless tobacco: Not on file  . Alcohol Use: No   OB History   Grav Para Term Preterm Abortions TAB SAB Ect Mult Living                 Review of Systems  10 systems reviewed and found to be negative, except as noted in the HPI  Allergies  Review of patient's allergies indicates no known allergies.  Home Medications   Prior to Admission medications   Medication Sig Start Date End Date Taking? Authorizing Provider  ciprofloxacin (CIPRO) 500 MG tablet Take 1 tablet (500 mg total) by mouth 2 (two) times daily. One po bid x 7 days 06/11/13   April K Palumbo-Rasch, MD  lansoprazole (PREVACID) 30 MG capsule Take 1 capsule (30 mg total) by mouth daily. 07/18/11 07/17/12  Fransico Meadow, PA-C  ranitidine (ZANTAC) 150 MG tablet Take 1 tablet (150 mg total) by mouth 2  (two) times daily. 07/07/11 07/06/12  Glendell Docker, NP   BP 104/64  Pulse 74  Temp(Src) 97.8 F (36.6 C) (Oral)  Resp 19  SpO2 98% Physical Exam  Nursing note and vitals reviewed. Constitutional: She is oriented to person, place, and time. She appears well-developed and well-nourished. No distress.  HENT:  Head: Normocephalic and atraumatic.  Mouth/Throat: Oropharynx is clear and moist.  Eyes: Conjunctivae and EOM are normal. Pupils are equal, round, and reactive to light.  Cardiovascular: Normal rate, regular rhythm and intact distal pulses.   Pulmonary/Chest: Effort normal and breath sounds normal. No stridor. No respiratory distress. She has no wheezes. She has no rales. She exhibits no tenderness.  Abdominal: Soft. Bowel sounds are normal.  Genitourinary:  No CVA tenderness bilaterally  Musculoskeletal: Normal range of motion.  No calf asymmetry, superficial collaterals, palpable cords, edema, Homans sign negative bilaterally.    Neurological: She is alert and oriented to person, place, and time.  Psychiatric: She has a normal mood and affect.    ED Course  Procedures (including critical care time) Labs Review Labs Reviewed  CBC WITH DIFFERENTIAL - Abnormal; Notable for the following:    RBC 5.26 (*)    All other components within normal limits  URINALYSIS, ROUTINE W REFLEX MICROSCOPIC - Abnormal; Notable for the following:    APPearance CLOUDY (*)    Hgb urine dipstick MODERATE (*)    Leukocytes, UA MODERATE (*)  All other components within normal limits  URINE MICROSCOPIC-ADD ON - Abnormal; Notable for the following:    Squamous Epithelial / LPF MANY (*)    Bacteria, UA MANY (*)    All other components within normal limits  URINE CULTURE  BASIC METABOLIC PANEL  TROPONIN I  PREGNANCY, URINE    Imaging Review Dg Chest 1 View  07/21/2013   CLINICAL DATA:  Weakness, dizziness and nausea.  EXAM: CHEST - 1 VIEW  COMPARISON:  Chest radiograph performed 03/02/2008   FINDINGS: The lungs are well-aerated and clear. There is no evidence of focal opacification, pleural effusion or pneumothorax.  The cardiomediastinal silhouette is within normal limits. No acute osseous abnormalities are seen.  IMPRESSION: No acute cardiopulmonary process seen.   Electronically Signed   By: Garald Balding M.D.   On: 07/21/2013 23:50     EKG Interpretation   Date/Time:  Tuesday July 21 2013 21:42:51 EDT Ventricular Rate:  77 PR Interval:  146 QRS Duration: 103 QT Interval:  429 QTC Calculation: 485 R Axis:   38 Text Interpretation:  Sinus rhythm Low voltage, precordial leads RSR' in  V1 or V2, right VCD or RVH Borderline prolonged QT interval Confirmed by  Christy Gentles  MD, [place] ([id number]) on 07/21/2013 11:54:52 PM      MDM   Final diagnoses:  UTI (lower urinary tract infection)  Near syncope    Filed Vitals:   07/21/13 2143 07/21/13 2144 07/21/13 2147 07/22/13 0055  BP: 141/80 147/82 139/88 104/64  Pulse: 79 69 65 74  Temp:    97.8 F (36.6 C)  TempSrc:    Oral  Resp:    19  SpO2:    98%    Jasmine Weber is a 43 y.o. female complaining of urinary frequency and near-syncopal event this afternoon. Cardiac workup with no arrhythmia. Blood work is unremarkable however urinalysis is consistent with infection.  Patient is likely dehydrated causing the presyncopal sensation. Patient was aggressively hydrated in the ED with significant improvement in symptoms. He started on antibiotics for UTI.    Pt verbalized understanding and agrees with care plan. Outpatient follow-up and return precautions given.    Discharge Medication List as of 07/22/2013 12:35 AM    START taking these medications   Details  cephALEXin (KEFLEX) 500 MG capsule Take 1 capsule (500 mg total) by mouth 2 (two) times daily., Starting 07/22/2013, Until Discontinued, Print              Monico Blitz, PA-C 07/23/13 1918

## 2013-07-21 NOTE — ED Notes (Signed)
Patient was at work and called EMS for near syncope. Upon EMS arrival patient was sitting down, but awake. No LOC. No deficits. Patient reports having headache off and on all day. Patient states she just feels weak and nauseated. Patient reports she has felt like this before, and it was because her BP was elevated. BP 164/82, CBG 78.

## 2013-07-22 LAB — PREGNANCY, URINE: PREG TEST UR: NEGATIVE

## 2013-07-22 MED ORDER — CEPHALEXIN 500 MG PO CAPS: 500.0000 mg | ORAL_CAPSULE | Freq: Two times a day (BID) | ORAL | Status: DC

## 2013-07-22 NOTE — Discharge Instructions (Signed)
Please contact your primary care doctor and let them know that you were seen in the emergency room. They must obtain records for evaluation and further management.   Follow with your primary care doctor for a check up in the next 24-48 hours.  Return to the emergency room IMMEDIATELY if you have any NEW or WORSENING symptoms Near-Syncope Near-syncope (commonly known as near fainting) is sudden weakness, dizziness, or feeling like you might pass out. This can happen when getting up or while standing for a long time. It is caused by a sudden decrease in blood flow to the brain, which can occur for various reasons. Most of the reasons are not serious.  HOME CARE Watch your condition for any changes.  Have someone stay with you until you feel stable.  If you feel like you are going to pass out:  Lie down right away.  Breathe deeply and steadily.  Move only when the feeling has gone away. Most of the time, this feeling lasts only a few minutes. You may feel tired for several hours.  Drink enough fluids to keep your pee (urine) clear or pale yellow.  If you are taking blood pressure or heart medicine, stand up slowly.  Follow up with your doctor as told. GET HELP RIGHT AWAY IF:   You have a severe headache.  You have unusual pain in the chest, belly (abdomen), or back.  You have bleeding from the mouth or butt (rectum), or you have black or tarry poop (stool).  You feel your heart beat differently than normal, or you have a very fast pulse.  You pass out, or you twitch and shake when you pass out.  You pass out when sitting or lying down.  You feel confused.  You have trouble walking.  You are weak.  You have vision problems. MAKE SURE YOU:   Understand these instructions.  Will watch your condition.  Will get help right away if you are not doing well or get worse. Document Released: 08/29/2007 Document Revised: 11/12/2012 Document Reviewed: 08/15/2012 [facility]  Patient Information 2014 [place].

## 2013-07-23 LAB — URINE CULTURE: Colony Count: 8000

## 2013-07-24 NOTE — ED Provider Notes (Signed)
Medical screening examination/treatment/procedure(s) were performed by non-physician practitioner and as supervising physician I was immediately available for consultation/collaboration.  Catelin Manthe T Jordy Verba, MD 07/24/13 2314 

## 2013-10-02 ENCOUNTER — Emergency Department (HOSPITAL_BASED_OUTPATIENT_CLINIC_OR_DEPARTMENT_OTHER): Payer: Medicaid Other

## 2013-10-02 ENCOUNTER — Emergency Department (HOSPITAL_BASED_OUTPATIENT_CLINIC_OR_DEPARTMENT_OTHER)
Admission: EM | Admit: 2013-10-02 | Discharge: 2013-10-02 | Disposition: A | Discharge status: Home / Self Care | Payer: Medicaid Other | Attending: Emergency Medicine | Admitting: Emergency Medicine

## 2013-10-02 ENCOUNTER — Encounter (HOSPITAL_BASED_OUTPATIENT_CLINIC_OR_DEPARTMENT_OTHER): Payer: Self-pay | Admitting: Emergency Medicine

## 2013-10-02 DIAGNOSIS — Z3202 Encounter for pregnancy test, result negative: Secondary | ICD-10-CM | POA: Insufficient documentation

## 2013-10-02 DIAGNOSIS — Z87891 Personal history of nicotine dependence: Secondary | ICD-10-CM | POA: Insufficient documentation

## 2013-10-02 DIAGNOSIS — D259 Leiomyoma of uterus, unspecified: Secondary | ICD-10-CM | POA: Insufficient documentation

## 2013-10-02 DIAGNOSIS — Z8639 Personal history of other endocrine, nutritional and metabolic disease: Secondary | ICD-10-CM | POA: Insufficient documentation

## 2013-10-02 DIAGNOSIS — Z79899 Other long term (current) drug therapy: Secondary | ICD-10-CM | POA: Insufficient documentation

## 2013-10-02 DIAGNOSIS — Z862 Personal history of diseases of the blood and blood-forming organs and certain disorders involving the immune mechanism: Secondary | ICD-10-CM | POA: Insufficient documentation

## 2013-10-02 LAB — URINALYSIS, ROUTINE W REFLEX MICROSCOPIC
Bilirubin Urine: NEGATIVE
GLUCOSE, UA: NEGATIVE mg/dL
Hgb urine dipstick: NEGATIVE
Ketones, ur: NEGATIVE mg/dL
LEUKOCYTES UA: NEGATIVE
NITRITE: NEGATIVE
PH: 6 (ref 5.0–8.0)
Protein, ur: NEGATIVE mg/dL
SPECIFIC GRAVITY, URINE: 1.029 (ref 1.005–1.030)
Urobilinogen, UA: 1 mg/dL (ref 0.0–1.0)

## 2013-10-02 LAB — PREGNANCY, URINE: Preg Test, Ur: NEGATIVE

## 2013-10-02 MED ORDER — HYDROCODONE-ACETAMINOPHEN 5-325 MG PO TABS: 2.0000 | ORAL_TABLET | ORAL | Status: DC | PRN

## 2013-10-02 MED ORDER — IBUPROFEN 800 MG PO TABS: 800.0000 mg | ORAL_TABLET | Freq: Three times a day (TID) | ORAL | Status: DC

## 2013-10-02 NOTE — ED Notes (Signed)
Patient transported to Ultrasound 

## 2013-10-02 NOTE — Discharge Instructions (Signed)
Fibroids Fibroids are lumps (tumors) that can occur any place in a woman's body. These lumps are not cancerous. Fibroids vary in size, weight, and where they grow. HOME CARE  Do not take aspirin.  Write down the number of pads or tampons you use during your period. Tell your doctor. This can help determine the best treatment for you. GET HELP RIGHT AWAY IF:  You have pain in your lower belly (abdomen) that is not helped with medicine.  You have cramps that are not helped with medicine.  You have more bleeding between or during your period.  You feel lightheaded or pass out (faint).  Your lower belly pain gets worse. MAKE SURE YOU:  Understand these instructions.  Will watch your condition.  Will get help right away if you are not doing well or get worse. Document Released: 04/14/2010 Document Revised: 06/04/2011 Document Reviewed: 04/14/2010 [employer] Patient Information 2015 [employer]. This information is not intended to replace advice given to you by your health care provider. Make sure you discuss any questions you have with your health care provider.  

## 2013-10-02 NOTE — ED Notes (Signed)
Pt c/o lower pelvic pain with painful  freq urination also c/o low back pain x 2 weeks

## 2013-10-02 NOTE — ED Provider Notes (Signed)
CSN: [id number]     Arrival date & time 10/02/13  1610 History   First MD Initiated Contact with Patient 10/02/13 1735     No chief complaint on file.    (Consider location/radiation/quality/duration/timing/severity/associated sxs/prior Treatment) Patient is a 43 y.o. female presenting with abdominal pain. The history is provided by the patient. No language interpreter was used.  Abdominal Pain Pain location:  Generalized Pain quality: aching   Pain radiates to:  Does not radiate Pain severity:  Moderate Timing:  Constant Progression:  Worsening Chronicity:  New Worsened by:  Nothing tried Ineffective treatments:  Acetaminophen  Pt complains of on going lower abdominal pressure.   Pt reports she feels like she has  Past Medical History  Diagnosis Date  . Hypercholesteremia    Past Surgical History  Procedure Laterality Date  . Tubal ligation     History reviewed. No pertinent family history. History  Substance Use Topics  . Smoking status: Former Research scientist (life sciences)  . Smokeless tobacco: Not on file  . Alcohol Use: No   OB History   Grav Para Term Preterm Abortions TAB SAB Ect Mult Living                 Review of Systems  Gastrointestinal: Positive for abdominal pain.  All other systems reviewed and are negative.     Allergies  Review of patient's allergies indicates no known allergies.  Home Medications   Prior to Admission medications   Medication Sig Start Date End Date Taking? Authorizing Provider  lansoprazole (PREVACID) 30 MG capsule Take 1 capsule (30 mg total) by mouth daily. 07/18/11 07/17/12  Fransico Meadow, PA-C  ranitidine (ZANTAC) 150 MG tablet Take 1 tablet (150 mg total) by mouth 2 (two) times daily. 07/07/11 07/06/12  Glendell Docker, NP   BP 128/71  Pulse 78  Temp(Src) 98.9 F (37.2 C) (Oral)  Resp 18  Ht 5\' 7"  (1.702 m)  Wt 182 lb (82.555 kg)  BMI 28.50 kg/m2  SpO2 100%  LMP 09/21/2013 Physical Exam  Nursing note and vitals  reviewed. Constitutional: She is oriented to person, place, and time. She appears well-developed and well-nourished.  HENT:  Head: Normocephalic and atraumatic.  Eyes: EOM are normal. Pupils are equal, round, and reactive to light.  Neck: Normal range of motion.  Cardiovascular: Normal rate and regular rhythm.   Pulmonary/Chest: Effort normal and breath sounds normal.  Abdominal: Soft. She exhibits no distension.  Musculoskeletal: Normal range of motion.  Neurological: She is alert and oriented to person, place, and time.  Skin: Skin is warm.  Psychiatric: She has a normal mood and affect.    ED Course  Procedures (including critical care time) Labs Review Labs Reviewed  URINALYSIS, ROUTINE W REFLEX MICROSCOPIC  PREGNANCY, URINE    Imaging Review No results found.   EKG Interpretation None      Results for orders placed during the hospital encounter of 10/02/13  URINALYSIS, ROUTINE W REFLEX MICROSCOPIC      Result Value Ref Range   Color, Urine YELLOW  YELLOW   APPearance CLEAR  CLEAR   Specific Gravity, Urine 1.029  1.005 - 1.030   pH 6.0  5.0 - 8.0   Glucose, UA NEGATIVE  NEGATIVE mg/dL   Hgb urine dipstick NEGATIVE  NEGATIVE   Bilirubin Urine NEGATIVE  NEGATIVE   Ketones, ur NEGATIVE  NEGATIVE mg/dL   Protein, ur NEGATIVE  NEGATIVE mg/dL   Urobilinogen, UA 1.0  0.0 - 1.0 mg/dL   Nitrite  NEGATIVE  NEGATIVE   Leukocytes, UA NEGATIVE  NEGATIVE  PREGNANCY, URINE      Result Value Ref Range   Preg Test, Ur NEGATIVE  NEGATIVE   US Transvaginal Non-ob  10/02/2013   CLINICAL DATA:  Pain.  Prior tubal ligation.  History of fibroids.  EXAM: TRANSABDOMINAL AND TRANSVAGINAL ULTRASOUND OF PELVIS  TECHNIQUE: Both transabdominal and transvaginal ultrasound examinations of the pelvis were performed. Transabdominal technique was performed for global imaging of the pelvis including uterus, ovaries, adnexal regions, and pelvic cul-de-sac. It was necessary to proceed with endovaginal  exam following the transabdominal exam to visualize the uterus and ovaries.  COMPARISON:  None  FINDINGS: Uterus  Measurements: 9.4 x 4.7 x 5.2 cm. 1.7 x 1.8 x 1.4 left fundal fibroid. 1.5 x 0.8 x 1.0 cm right posterior fundal fibroid. Nabothian cysts.  Endometrium  Thickness: 8.9 mm.  No focal abnormality visualized.  Right ovary  Measurements: 3.5 x 2.2 x 2.9 cm. Normal appearance/no adnexal mass.  Left ovary  Measurements: 4.6 x 2.0 x 2.9 cm. 2.3 x 1.8 x 2.0 cm  Other findings  Trace free fluid.  IMPRESSION: 1.  Fibroid uterus.  2.  Trace free pelvic fluid.   Electronically Signed   By: Marcello Moores  Register   On: 10/02/2013 18:57   US Pelvis Complete  10/02/2013   CLINICAL DATA:  Pain.  Prior tubal ligation.  History of fibroids.  EXAM: TRANSABDOMINAL AND TRANSVAGINAL ULTRASOUND OF PELVIS  TECHNIQUE: Both transabdominal and transvaginal ultrasound examinations of the pelvis were performed. Transabdominal technique was performed for global imaging of the pelvis including uterus, ovaries, adnexal regions, and pelvic cul-de-sac. It was necessary to proceed with endovaginal exam following the transabdominal exam to visualize the uterus and ovaries.  COMPARISON:  None  FINDINGS: Uterus  Measurements: 9.4 x 4.7 x 5.2 cm. 1.7 x 1.8 x 1.4 left fundal fibroid. 1.5 x 0.8 x 1.0 cm right posterior fundal fibroid. Nabothian cysts.  Endometrium  Thickness: 8.9 mm.  No focal abnormality visualized.  Right ovary  Measurements: 3.5 x 2.2 x 2.9 cm. Normal appearance/no adnexal mass.  Left ovary  Measurements: 4.6 x 2.0 x 2.9 cm. 2.3 x 1.8 x 2.0 cm  Other findings  Trace free fluid.  IMPRESSION: 1.  Fibroid uterus.  2.  Trace free pelvic fluid.   Electronically Signed   By: Marcello Moores  Register   On: 10/02/2013 18:57     MDM Pt counseled on fibroid,   I suspect this is the cause of pt's feeling of needing to urinate frequently.   Pt advised to follow up with her MD   Final diagnoses:  Uterine leiomyoma, unspecified location    Ibuprofen Hydrocodone Pt advised to follow up with her Gyn for evaluatiom=n    Fransico Meadow, PA-C 10/02/13 2216

## 2013-10-02 NOTE — ED Provider Notes (Signed)
Medical screening examination/treatment/procedure(s) were performed by non-physician practitioner and as supervising physician I was immediately available for consultation/collaboration.   EKG Interpretation None        Wandra Arthurs, MD 10/02/13 2330

## 2013-11-10 ENCOUNTER — Emergency Department (HOSPITAL_BASED_OUTPATIENT_CLINIC_OR_DEPARTMENT_OTHER): Payer: Medicaid Other

## 2013-11-10 ENCOUNTER — Emergency Department (HOSPITAL_BASED_OUTPATIENT_CLINIC_OR_DEPARTMENT_OTHER)
Admission: EM | Admit: 2013-11-10 | Discharge: 2013-11-10 | Disposition: A | Discharge status: Home / Self Care | Payer: Medicaid Other | Attending: Emergency Medicine | Admitting: Emergency Medicine

## 2013-11-10 ENCOUNTER — Encounter (HOSPITAL_BASED_OUTPATIENT_CLINIC_OR_DEPARTMENT_OTHER): Payer: Self-pay | Admitting: Emergency Medicine

## 2013-11-10 DIAGNOSIS — Z87891 Personal history of nicotine dependence: Secondary | ICD-10-CM | POA: Insufficient documentation

## 2013-11-10 DIAGNOSIS — Z862 Personal history of diseases of the blood and blood-forming organs and certain disorders involving the immune mechanism: Secondary | ICD-10-CM | POA: Insufficient documentation

## 2013-11-10 DIAGNOSIS — R0789 Other chest pain: Secondary | ICD-10-CM

## 2013-11-10 DIAGNOSIS — R110 Nausea: Secondary | ICD-10-CM | POA: Insufficient documentation

## 2013-11-10 DIAGNOSIS — Z79899 Other long term (current) drug therapy: Secondary | ICD-10-CM | POA: Insufficient documentation

## 2013-11-10 DIAGNOSIS — Z791 Long term (current) use of non-steroidal anti-inflammatories (NSAID): Secondary | ICD-10-CM | POA: Insufficient documentation

## 2013-11-10 DIAGNOSIS — R42 Dizziness and giddiness: Secondary | ICD-10-CM | POA: Insufficient documentation

## 2013-11-10 DIAGNOSIS — Z8639 Personal history of other endocrine, nutritional and metabolic disease: Secondary | ICD-10-CM | POA: Insufficient documentation

## 2013-11-10 DIAGNOSIS — R079 Chest pain, unspecified: Secondary | ICD-10-CM | POA: Insufficient documentation

## 2013-11-10 DIAGNOSIS — Z3202 Encounter for pregnancy test, result negative: Secondary | ICD-10-CM | POA: Insufficient documentation

## 2013-11-10 LAB — CBC WITH DIFFERENTIAL/PLATELET
Basophils Absolute: 0 10*3/uL (ref 0.0–0.1)
Basophils Relative: 0 % (ref 0–1)
Eosinophils Absolute: 0.1 10*3/uL (ref 0.0–0.7)
Eosinophils Relative: 1 % (ref 0–5)
HCT: 37.4 % (ref 36.0–46.0)
HEMOGLOBIN: 12.9 g/dL (ref 12.0–15.0)
LYMPHS PCT: 17 % (ref 12–46)
Lymphs Abs: 1.5 10*3/uL (ref 0.7–4.0)
MCH: 27.7 pg (ref 26.0–34.0)
MCHC: 34.5 g/dL (ref 30.0–36.0)
MCV: 80.3 fL (ref 78.0–100.0)
MONOS PCT: 8 % (ref 3–12)
Monocytes Absolute: 0.7 10*3/uL (ref 0.1–1.0)
NEUTROS ABS: 6.5 10*3/uL (ref 1.7–7.7)
NEUTROS PCT: 74 % (ref 43–77)
PLATELETS: 270 10*3/uL (ref 150–400)
RBC: 4.66 MIL/uL (ref 3.87–5.11)
RDW: 14.7 % (ref 11.5–15.5)
WBC: 8.8 10*3/uL (ref 4.0–10.5)

## 2013-11-10 LAB — COMPREHENSIVE METABOLIC PANEL
ALK PHOS: 85 U/L (ref 39–117)
ALT: 10 U/L (ref 0–35)
ANION GAP: 13 (ref 5–15)
AST: 14 U/L (ref 0–37)
Albumin: 3.8 g/dL (ref 3.5–5.2)
BILIRUBIN TOTAL: 0.4 mg/dL (ref 0.3–1.2)
BUN: 10 mg/dL (ref 6–23)
CHLORIDE: 102 meq/L (ref 96–112)
CO2: 23 meq/L (ref 19–32)
Calcium: 9.7 mg/dL (ref 8.4–10.5)
Creatinine, Ser: 0.6 mg/dL (ref 0.50–1.10)
GFR calc non Af Amer: >90 mL/min (ref >90)
GLUCOSE: 98 mg/dL (ref 70–99)
Potassium: 3.8 mEq/L (ref 3.7–5.3)
SODIUM: 138 meq/L (ref 137–147)
TOTAL PROTEIN: 7.6 g/dL (ref 6.0–8.3)

## 2013-11-10 LAB — PREGNANCY, URINE: PREG TEST UR: NEGATIVE

## 2013-11-10 LAB — TROPONIN I

## 2013-11-10 MED ORDER — ONDANSETRON HCL 4 MG/2ML IJ SOLN
4.0000 mg | Freq: Once | INTRAMUSCULAR | Status: AC
  Administered 2013-11-10: 4 mg via INTRAVENOUS
  Filled 2013-11-10: qty 2

## 2013-11-10 MED ORDER — SODIUM CHLORIDE 0.9 % IV BOLUS (SEPSIS)
1000.0000 mL | INTRAVENOUS | Status: AC
  Administered 2013-11-10: 1000 mL via INTRAVENOUS

## 2013-11-10 NOTE — ED Provider Notes (Signed)
CSN: [id number]     Arrival date & time 11/10/13  1403 History   First MD Initiated Contact with Patient 11/10/13 1450     Chief Complaint  Patient presents with  . Chest Pain     (Consider location/radiation/quality/duration/timing/severity/associated sxs/prior Treatment) Patient is a 43 y.o. female presenting with chest pain. The history is provided by the patient.  Chest Pain Pain location:  Substernal area Pain quality: dull and sharp   Pain radiates to:  L shoulder Pain radiates to the back: no   Pain severity:  Mild (7/10) Onset quality:  Sudden Duration:  2 hours Timing:  Intermittent Progression:  Unchanged Chronicity:  New Context comment:  Light housekeeping Relieved by:  Nothing Worsened by:  Nothing tried Ineffective treatments:  None tried Associated symptoms: dizziness and nausea (mild)   Associated symptoms: no abdominal pain, no back pain, no cough, no fatigue, no fever, no headache, no shortness of breath and not vomiting     Past Medical History  Diagnosis Date  . Hypercholesteremia    Past Surgical History  Procedure Laterality Date  . Tubal ligation     No family history on file. History  Substance Use Topics  . Smoking status: Former Research scientist (life sciences)  . Smokeless tobacco: Not on file  . Alcohol Use: Yes   OB History   Grav Para Term Preterm Abortions TAB SAB Ect Mult Living                 Review of Systems  Constitutional: Negative for fever and fatigue.  HENT: Negative for congestion and drooling.   Eyes: Negative for pain.  Respiratory: Negative for cough and shortness of breath.   Cardiovascular: Positive for chest pain.  Gastrointestinal: Positive for nausea (mild). Negative for vomiting, abdominal pain and diarrhea.  Genitourinary: Negative for dysuria and hematuria.  Musculoskeletal: Negative for back pain, gait problem and neck pain.  Skin: Negative for color change.  Neurological: Positive for dizziness. Negative for headaches.   Hematological: Negative for adenopathy.  Psychiatric/Behavioral: Negative for behavioral problems.  All other systems reviewed and are negative.     Allergies  Review of patient's allergies indicates no known allergies.  Home Medications   Prior to Admission medications   Medication Sig Start Date End Date Taking? Authorizing Provider  HYDROcodone-acetaminophen (NORCO/VICODIN) 5-325 MG per tablet Take 2 tablets by mouth every 4 (four) hours as needed. 10/02/13   Fransico Meadow, PA-C  ibuprofen (ADVIL,MOTRIN) 800 MG tablet Take 1 tablet (800 mg total) by mouth 3 (three) times daily. 10/02/13   Fransico Meadow, PA-C  lansoprazole (PREVACID) 30 MG capsule Take 1 capsule (30 mg total) by mouth daily. 07/18/11 07/17/12  Fransico Meadow, PA-C  ranitidine (ZANTAC) 150 MG tablet Take 1 tablet (150 mg total) by mouth 2 (two) times daily. 07/07/11 07/06/12  Glendell Docker, NP   BP 127/79  Pulse 79  Temp(Src) 98.3 F (36.8 C) (Oral)  Resp 18  Ht 5\' 7"  (1.702 m)  Wt 184 lb (83.462 kg)  BMI 28.81 kg/m2  SpO2 100%  LMP 10/10/2013 Physical Exam  Nursing note and vitals reviewed. Constitutional: She is oriented to person, place, and time. She appears well-developed and well-nourished.  HENT:  Head: Normocephalic and atraumatic.  Mouth/Throat: Oropharynx is clear and moist. No oropharyngeal exudate.  Eyes: Conjunctivae and EOM are normal. Pupils are equal, round, and reactive to light.  Neck: Normal range of motion. Neck supple.  Cardiovascular: Normal rate, regular rhythm, normal heart sounds and  intact distal pulses.  Exam reveals no gallop and no friction rub.   No murmur heard. Pulmonary/Chest: Effort normal and breath sounds normal. No respiratory distress. She has no wheezes.  Abdominal: Soft. Bowel sounds are normal. There is no tenderness. There is no rebound and no guarding.  Musculoskeletal: Normal range of motion. She exhibits no edema and no tenderness.  Neurological: She is alert  and oriented to person, place, and time.  Skin: Skin is warm and dry.  Psychiatric: She has a normal mood and affect. Her behavior is normal.    ED Course  Procedures (including critical care time) Labs Review Labs Reviewed  CBC WITH DIFFERENTIAL  COMPREHENSIVE METABOLIC PANEL  TROPONIN I  PREGNANCY, URINE    Imaging Review Dg Chest 2 View  11/10/2013   CLINICAL DATA:  Chest pain and difficulty breathing  EXAM: CHEST  2 VIEW  COMPARISON:  July 21, 2013  FINDINGS: Lungs are clear. The heart size and pulmonary vascularity are normal. No adenopathy. No pneumothorax. No bone lesions.  IMPRESSION: No abnormality noted.   Electronically Signed   By: Lowella Grip M.D.   On: 11/10/2013 15:33     EKG Interpretation   Date/Time:  Tuesday November 10 2013 14:10:47 EDT Ventricular Rate:  77 PR Interval:  134 QRS Duration: 80 QT Interval:  408 QTC Calculation: 461 R Axis:   36 Text Interpretation:  Normal sinus rhythm No significant change since last  tracing Confirmed by Nayomi Tabron  MD, Butler Vegh ([id number]) on 11/10/2013 3:13:09 PM      MDM   Final diagnoses:  Atypical chest pain    3:09 PM 43 y.o. 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 AFVSS here. RF's include HLP, FH of heart dis. Denies cp currently.   4:37 PM: Pt feeling better. Continues to deny cp. I interpreted/reviewed the labs and/or imaging which were non-contributory. Low risk for MACE per HEART score.  I have discussed the diagnosis/risks/treatment options with the patient and believe the pt to be eligible for discharge home to follow-up with and estab w/ a pcp, resources provided. We also discussed returning to the ED immediately if new or worsening sx occur. We discussed the sx which are most concerning (e.g., return of cp, sob, fever) that necessitate immediate return. Medications administered to the patient during their visit and any new prescriptions  provided to the patient are listed below.  Medications given during this visit Medications  ondansetron (ZOFRAN) injection 4 mg (4 mg Intravenous Given 11/10/13 1532)  sodium chloride 0.9 % bolus 1,000 mL (1,000 mLs Intravenous New Bag/Given 11/10/13 1532)    New Prescriptions   No medications on file       Pamella Pert, MD 11/10/13 1639

## 2013-11-10 NOTE — ED Notes (Signed)
Pt states she has c/p left side of chest. States she was putting pillows in a case and pain started. Also feels lightheaded no other sx. States she has been under a lot of stress lately. Rates pain 7/10  And pain comes and goes.

## 2013-11-10 NOTE — Discharge Instructions (Signed)
Chest Pain (Nonspecific) °It is often hard to give a diagnosis for the cause of chest pain. There is always a chance that your pain could be related to something serious, such as a heart attack or a blood clot in the lungs. You need to follow up with your doctor. °HOME CARE °· If antibiotic medicine was given, take it as directed by your doctor. Finish the medicine even if you start to feel better. °· For the next few days, avoid activities that bring on chest pain. Continue physical activities as told by your doctor. °· Do not use any tobacco products. This includes cigarettes, chewing tobacco, and e-cigarettes. °· Avoid drinking alcohol. °· Only take medicine as told by your doctor. °· Follow your doctor's suggestions for more testing if your chest pain does not go away. °· Keep all doctor visits you made. °GET HELP IF: °· Your chest pain does not go away, even after treatment. °· You have a rash with blisters on your chest. °· You have a fever. °GET HELP RIGHT AWAY IF:  °· You have more pain or pain that spreads to your arm, neck, jaw, back, or belly (abdomen). °· You have shortness of breath. °· You cough more than usual or cough up blood. °· You have very bad back or belly pain. °· You feel sick to your stomach (nauseous) or throw up (vomit). °· You have very bad weakness. °· You pass out (faint). °· You have chills. °This is an emergency. Do not wait to see if the problems will go away. Call your local emergency services (911 in [place] Do not drive yourself to the hospital. °MAKE SURE YOU:  °· Understand these instructions. °· Will watch your condition. °· Will get help right away if you are not doing well or get worse. °Document Released: 08/29/2007 Document Revised: 03/17/2013 Document Reviewed: 08/29/2007 °ExitCare® Patient Information ©2015 [employer]. This information is not intended to replace advice given to you by your health care provider. Make sure you discuss any questions you have with your  health care provider. ° ° °Emergency Department Resource Guide °1) Find a Doctor and Pay Out of Pocket °Although you won't have to find out who is covered by your insurance plan, it is a good idea to ask around and get recommendations. You will then need to call the office and see if the doctor you have chosen will accept you as a new patient and what types of options they offer for patients who are self-pay. Some doctors offer discounts or will set up payment plans for their patients who do not have insurance, but you will need to ask so you aren't surprised when you get to your appointment. ° °2) Contact Your Local Health Department °Not all health departments have doctors that can see patients for sick visits, but many do, so it is worth a call to see if yours does. If you don't know where your local health department is, you can check in your phone book. The CDC also has a tool to help you locate your state's health department, and many state websites also have listings of all of their local health departments. ° °3) Find a Walk-in Clinic °If your illness is not likely to be very severe or complicated, you may want to try a walk in clinic. These are popping up all over the country in pharmacies, drugstores, and shopping centers. They're usually staffed by nurse practitioners or physician assistants that have been trained to treat common   illnesses and complaints. They're usually fairly quick and inexpensive. However, if you have serious medical issues or chronic medical problems, these are probably not your best option. ° °No Primary Care Doctor: °- Call [facility] at  832-8000 - they can help you locate a primary care doctor that  accepts your insurance, provides certain services, etc. °- Physician Referral Service- 1-800-533-3463 ° °Chronic Pain Problems: °Organization         Address  Phone   Notes  °[facility]  (336) 297-2271 Patients need to be referred by their primary care doctor.   ° °Medication Assistance: °Organization         Address  Phone   Notes  °[place] [facility] [address] °[place], [place] [address] (336) 641-8030 --Must be a resident of [place] °-- Must have NO insurance coverage whatsoever (no Medicaid/ Medicare, etc.) °-- The pt. MUST have a primary care doctor that directs their care regularly and follows them in the community °  °[facility]  (866) 331-1348   °[employer]  (888) 892-1162   ° °Agencies that provide inexpensive medical care: °Organization         Address  Phone   Notes  °[facility]  (336) 832-8035   °[facility]    (336) 832-7272   °[facility] [address] °[place], [place] [address] (336) 832-4777   °[facility] [address], °[place] (336) 271-4999   °[facility]    (336) 373-0678   °[facility]    (336) 272-1050   °[facility] ° [address], [place] Phone:  (336) 832-4444, Fax:  (336) 832-4440 Hours of Operation:  9 am - 6 pm, M-F.  Also accepts Medicaid/Medicare and self-pay.  °[facility] ° [address], [place] Phone: (336) 832-3150, Fax: (336) 832-3151. Hours of Operation:  8:30 am - 5:30 pm, M-F.  Also accepts Medicaid and self-pay.  °[facility] [place] [address], [place] Phone: (336) 878-6027   °[facility] [address], [place], [place] (336)723-1848, Ext. 123 Mondays & Thursdays: 7-9 AM.  First 15 patients are seen on a first come, first serve basis. °  ° °Medicaid-accepting [place] Providers: ° °Organization         Address  Phone   Notes  °[facility] [address], [place] (336) 641-2100 Also accepts self-pay patients.  °[facility] [address], [place] ° (336) 856-9996   °[facility] [address], [place] (336) 288-8857   °[facility] [address] [place] [address], [place] (336) 299-7000   °Veita Bland [address], [place]  ° (336) 373-1557 Only accepts [place] Access Medicaid patients after they have their name applied to their card.  ° °Self-Pay (no insurance) in [place]: ° °Organization         Address  Phone   Notes  °Sickle Cell Patients, [facility] [address], [place] (336) 832-1970   °[facility] [address], [place] (336) 832-4400   °[facility] ° [address], [place] (336) 992-4800   °[facility]/Dr. Osei-Bonsu ° [address] [place] [address], [place] or [address],   [place] (336) 841-8500 Phone number for both [place] and [place] locations is the same.  °[facility] [address], [place] (336) 299-0000   °[facility] [address] [place] [address], [place] or [address] (336) 852-7530 °(336) 878-2260   °[facility] [address], [place] (336) 350-1642, phone; (336) 294-5005, fax Sees patients 1st and 3rd Saturday of every month.  Must not qualify for public or private insurance (i.e. Medicaid, Medicare, [place] Health Choice, Veterans' Benefits) • Household income should be no more than 200% of the poverty level •The clinic cannot treat you if you are pregnant or think you are pregnant • Sexually transmitted diseases are not treated at the clinic.  ° ° °Dental Care: °Organization         Address  Phone  Notes  °[facility] [address], [place] (336) 641-6152 Accepts children up to age 21 who are enrolled in Medicaid or [place] Health Choice; pregnant women with a Medicaid card; and children who have applied for Medicaid or [place] Health Choice, but were declined, whose parents can pay a reduced fee at time of service.    °[facility] [place]  [address], [place] (336) 641-7733 Accepts children up to age 21 who are enrolled in Medicaid or [place] Health Choice; pregnant women with a Medicaid card; and children who have applied for Medicaid or [place] Health Choice, but were declined, whose parents can pay a reduced fee at time of service.  °[facility] ° [address], [place] (336) 641-4533 Patients are seen by appointment only. Walk-ins are not accepted. [facility] will see patients 18 years of age and older. °Monday - Tuesday (8am-5pm) °Most Wednesdays (8:30-5pm) °$30 per visit, cash only  °[facility] ° [address], [place] (336) 641-4533 Patients are seen by appointment only. Walk-ins are not accepted. [facility] will see patients 18 years of age and older. °One Wednesday Evening (Monthly: Volunteer Based).  $30 per visit, cash only  °[facility]  (919) 537-3737 for adults; Children under age 4, call Graduate Pediatric Dentistry at (919) [phone]. Children aged 4-14, please call (919) [phone] to request a pediatric application. ° Dental services are provided in all areas of dental care including fillings, crowns and bridges, complete and partial dentures, implants, gum treatment, root canals, and extractions. Preventive care is also provided. Treatment is provided to both adults and children. °Patients are selected via a lottery and there is often a waiting list. °  °[facility] [address], °[place] ° (336) 763-8833 [email or web address] °  °[facility] [address], [place], [place] (336)723-1848, Ext. 123 Second and Fourth Thursday of each month, opens at 6:30 AM; Clinic ends at 9 AM.  Patients are seen on a first-come first-served basis, and a limited number are seen during each clinic.  ° °[facility] ° [address], [place], [place] (336)  723-7904   Eligibility Requirements °You must have lived in [place], [place], or [place] counties for at least the last three months. °  You cannot be eligible for state or federal sponsored healthcare insurance, including Veterans Administration, Medicaid, or Medicare. °  You generally cannot be eligible for healthcare insurance through your employer.  °  How to apply: °Eligibility screenings are held every Tuesday   and Wednesday afternoon from 1:00 pm until 4:00 pm. You do not need an appointment for the interview!  °[facility] [address], [place], [place] 336-631-2330   °[facility]  336-342-8273   °[facility]  336-703-3100   °[facility]  336-570-6415   ° °Behavioral Health Resources in the Community: °Intensive Outpatient Programs °Organization         Address  Phone  Notes  °[facility] [address], [place], [place] 336-878-6098   °[facility] [address], [place], [place] 336-832-9800   °[facility] [address], [place], [place] ° 336-882-2125   °[facility] [address],  °[place], [place] 1-800-853-5163 or 336-641-4981   °Substance Abuse Resources °Organization         Address  Phone  Notes  °[facility]  336-882-2125   °[facility]  336-784-9470   °[facility]  336-285-9073   °[facility]  336-845-3988   °Residential & Outpatient Substance Abuse Program  1-800-659-3381   °Psychological Services °Organization         Address  Phone  Notes  ° Health  336- 832-9600   °[facility]  336- 378-7881   °[facility] [address], [place] 1-800-853-5163 or 336-641-4981   ° °Mobile Crisis Teams °Organization         Address  Phone  Notes  °[facility], Mobile Crisis Care Unit  1-877-626-1772   °Assertive °[facility] ° [address] [place], [place] 336-834-9664   °Sharon DeEsch [address] °[place] 336-554-5454   ° °Self-Help/Support Groups °Organization         Address  Phone             Notes  °[facility] [place] - variety of support groups  336- 373-1402 Call for more information  °Narcotics Anonymous (NA), [facility] [address], °[place]  2 meetings at this location  ° °Residential Treatment Programs °Organization         Address  Phone  Notes  °[facility] [address],    °[place]  1-866-801-8205   °[facility] ° [address], [place], [place] 704-293-8524   °[facility] [address], [place] 336-845-3988 Admissions: 8am-3pm M-F  °[facility] [address]    °[place], [place] 336-841-1104   °[facility] [address], [place], [place] 336-379-7146   °[facility] [address]  °[place], [place] 336-285-9073   °[facility] Intensive Outpatient [address], [place], [place] 336-852-3033   °[facility] [address]  °[place], [place] 1-877-615-2722 or 336-784-9470   °[facility] [address] [place], [place] 336-227-7417 Accepts Medicaid  °[facility] [address]  °[place] 1-800-659-3381 Substance Abuse/Addiction Treatment  ° °[place] Behavioral Health Resources °Organization         Address  Phone  Notes  °[facility]  (888) 581-9988   °Julie Brannon, PhD [address] [place], [place]   (336) 349-5553 or (336) 951-0000   °[facility]   [address] °, [place] (336) 349-4454   °[facility] [address], [place], [place] (336) 342-8316 Insurance/Medicaid/sponsorship through [facility]  °[facility] [address]                                    [place], [place] (336) 342-8316  Therapy/tele-psych/case  °[facility] [address]  ° [place], [place] (336) 349-2233    °Dr. Arfeen  (336) 349-4544   °[facility] [place]  [employer] [place] [facility] 1) [address], [place] °2) [address], [place] °3)  [address], [place] (336) 349-3220 °(336) 342-7768 ° °(336) 342-8140   °[place] [facility] (336) 342-1394 or (336) 342-3537 (After Hours)    ° ° ° °

## 2013-11-10 NOTE — ED Notes (Signed)
Pt says after eating lunch today she started having cp and dizziness.

## 2014-01-25 ENCOUNTER — Telehealth (HOSPITAL_COMMUNITY): Payer: Self-pay | Admitting: *Deleted

## 2014-01-25 NOTE — Telephone Encounter (Signed)
Telephoned patient at home # and left message to return call to [facility]CP 

## 2014-02-09 ENCOUNTER — Other Ambulatory Visit (HOSPITAL_COMMUNITY): Payer: Self-pay | Admitting: *Deleted

## 2014-02-09 DIAGNOSIS — N63 Unspecified lump in unspecified breast: Secondary | ICD-10-CM

## 2014-03-02 ENCOUNTER — Encounter (HOSPITAL_COMMUNITY): Payer: Self-pay | Admitting: *Deleted

## 2014-03-03 ENCOUNTER — Encounter (HOSPITAL_COMMUNITY): Payer: Self-pay

## 2014-03-03 ENCOUNTER — Ambulatory Visit
Admission: RE | Admit: 2014-03-03 | Discharge: 2014-03-03 | Disposition: A | Discharge status: Home / Self Care | Payer: No Typology Code available for payment source | Source: Ambulatory Visit | Attending: Obstetrics and Gynecology | Admitting: Obstetrics and Gynecology

## 2014-03-03 ENCOUNTER — Ambulatory Visit (HOSPITAL_COMMUNITY)
Admission: RE | Admit: 2014-03-03 | Discharge: 2014-03-03 | Disposition: A | Discharge status: Home / Self Care | Payer: Medicaid Other | Source: Ambulatory Visit | Attending: Obstetrics and Gynecology | Admitting: Obstetrics and Gynecology

## 2014-03-03 ENCOUNTER — Other Ambulatory Visit (HOSPITAL_COMMUNITY): Payer: Self-pay | Admitting: Obstetrics and Gynecology

## 2014-03-03 VITALS — BP 124/72 | Temp 98.1°F | Ht 67.0 in | Wt 192.8 lb

## 2014-03-03 DIAGNOSIS — N644 Mastodynia: Secondary | ICD-10-CM

## 2014-03-03 DIAGNOSIS — N63 Unspecified lump in unspecified breast: Secondary | ICD-10-CM

## 2014-03-03 DIAGNOSIS — R87612 Low grade squamous intraepithelial lesion on cytologic smear of cervix (LGSIL): Secondary | ICD-10-CM

## 2014-03-03 DIAGNOSIS — Z1239 Encounter for other screening for malignant neoplasm of breast: Secondary | ICD-10-CM

## 2014-03-03 NOTE — Progress Notes (Signed)
Complaints of bilateral outer breast pain x 2 years. Patient stated pain comes and goes rating it at a 6-7 out of 10. Patient referred to [facility] by [facility] due to needing follow-up for an abnormal Pap smear.  Pap Smear: Pap smear not completed today. Last Pap smear was 01/14/2014 at [facility] and LGS[facility].Referred patient to the [facility] for a colposcopy per recommendation to follow-up for abnormal Pap smear. Appointment scheduled for Monday, April 05, 2014 at 1300. Per patient her most recent Pap smear is the only abnormal Pap smear that she has had. Last Pap smear result is in EPIC under media.  Physical exam: Breasts Breasts symmetrical. No skin abnormalities bilateral breasts. No nipple retraction bilateral breasts. No nipple discharge bilateral breasts. No lymphadenopathy. No lumps palpated bilateral breasts. Complaints of bilateral outer breast tenderness on exam. Referred patient to the [facility] for a diagnostic mammogram. Appointment scheduled for Wednesday, March 03, 2014 at 1530.  Pelvic/Bimanual No Pap smear completed today since last Pap smear was 01/14/2014. Pap smear not indicated per [facility]CP guidelines.

## 2014-03-03 NOTE — Patient Instructions (Signed)
Explained to [employer] the follow-up needed for her abnormal Pap smear. Referred patient to the [facility] for a colposcopy per recommendation to follow-up for abnormal Pap smear. Appointment scheduled for Monday, April 05, 2014 at 1300. Referred patient to the [facility] for a diagnostic mammogram. Appointment scheduled for Wednesday, March 03, 2014 at 1530. Jasmine Weber verbalized understanding.  Shamir Tuzzolino, Arvil [place], RN 2:00 PM

## 2014-03-11 ENCOUNTER — Ambulatory Visit: Payer: No Typology Code available for payment source

## 2014-03-11 ENCOUNTER — Other Ambulatory Visit: Payer: No Typology Code available for payment source

## 2014-04-05 ENCOUNTER — Other Ambulatory Visit (HOSPITAL_COMMUNITY)
Admission: RE | Admit: 2014-04-05 | Discharge: 2014-04-05 | Disposition: A | Discharge status: Home / Self Care | Payer: Self-pay | Source: Ambulatory Visit | Attending: Nurse Practitioner | Admitting: Nurse Practitioner

## 2014-04-05 ENCOUNTER — Ambulatory Visit (INDEPENDENT_AMBULATORY_CARE_PROVIDER_SITE_OTHER): Payer: Self-pay | Admitting: Nurse Practitioner

## 2014-04-05 ENCOUNTER — Encounter: Payer: Self-pay | Admitting: Nurse Practitioner

## 2014-04-05 VITALS — BP 118/70 | HR 76 | Temp 98.3°F | Ht 67.0 in | Wt 191.6 lb

## 2014-04-05 DIAGNOSIS — E669 Obesity, unspecified: Secondary | ICD-10-CM

## 2014-04-05 DIAGNOSIS — N942 Vaginismus: Secondary | ICD-10-CM

## 2014-04-05 DIAGNOSIS — R87612 Low grade squamous intraepithelial lesion on cytologic smear of cervix (LGSIL): Secondary | ICD-10-CM

## 2014-04-05 DIAGNOSIS — R87619 Unspecified abnormal cytological findings in specimens from cervix uteri: Secondary | ICD-10-CM | POA: Insufficient documentation

## 2014-04-05 LAB — POCT PREGNANCY, URINE: PREG TEST UR: NEGATIVE

## 2014-04-05 NOTE — Progress Notes (Signed)
    GYNECOLOGY CLINIC COLPOSCOPY PROCEDURE NOTE Mykhia Danish  44 y.o. [phone] here for colposcopy for low-grade squamous intraepithelial neoplasia (LGSIL - encompassing HPV,mild dysplasia,CIN I) pap smear on 01/14/2014. Discussed role for HPV in cervical dysplasia, need for surveillance.  Patient given informed consent, signed copy in the chart, time out was performed.  Placed in lithotomy position. Cervix viewed with speculum and colposcope after application of acetic acid.   Colposcopy adequate? Yes  visible lesion(s) at 6, 11, 9  o'clock; biopsies obtained.  ECC specimen obtained. All specimens were labelled and sent to pathology.  Patient was given post procedure instructions.  Will follow up pathology and manage accordingly.  Routine preventative health maintenance measures emphasized.  Results for orders placed or performed in visit on 04/05/14 (from the past 24 hour(s))  Pregnancy, urine POC     Status: None   Collection Time: 04/05/14  1:28 PM  Result Value Ref Range   Preg Test, Ur NEGATIVE NEGATIVE     Shila Kruczek, NP [facility], [place]

## 2014-04-05 NOTE — Patient Instructions (Signed)
Colposcopy Care After Colposcopy is a procedure in which a special tool is used to magnify the surface of the cervix. A tissue sample (biopsy) may also be taken. This sample will be looked at for cervical cancer or other problems. After the test:  You may have some cramping.  Lie down for a few minutes if you feel lightheaded.   You may have some bleeding which should stop in a few days. HOME CARE  Do not have sex or use tampons for 2 to 3 days or as told.  Only take medicine as told by your doctor.  Continue to take your birth control pills as usual. Finding out the results of your test Ask when your test results will be ready. Make sure you get your test results. GET HELP RIGHT AWAY IF:  You are bleeding a lot or are passing blood clots.  You develop a fever of 102 F (38.9 C) or higher.  You have abnormal vaginal discharge.  You have cramps that do not go away with medicine.  You feel lightheaded, dizzy, or pass out (faint). MAKE SURE YOU:   Understand these instructions.  Will watch your condition.  Will get help right away if you are not doing well or get worse. Document Released: 08/29/2007 Document Revised: 06/04/2011 Document Reviewed: 10/09/2012 [facility] Patient Information 2015 [place], [place]. This information is not intended to replace advice given to you by your health care provider. Make sure you discuss any questions you have with your health care provider.

## 2014-04-05 NOTE — Addendum Note (Signed)
Addended by: Geanie Logan on: 04/05/2014 03:24 PM   Modules accepted: Orders

## 2014-04-07 ENCOUNTER — Telehealth: Payer: Self-pay

## 2014-04-07 NOTE — Telephone Encounter (Signed)
-----   Message from Lavonia Drafts, MD sent at 04/07/2014  3:32 PM EST ----- Please call pt. Her cervical bx were neg. Rec f/u PAP in 1 year.   Thx,  clh-S

## 2014-04-07 NOTE — Telephone Encounter (Signed)
Called patient and informed her of results and recommendation. Patient verbalized understanding and gratitude. No questions or concerns.

## 2014-04-09 ENCOUNTER — Encounter: Payer: Self-pay | Admitting: *Deleted

## 2014-04-15 ENCOUNTER — Telehealth: Payer: Self-pay | Admitting: *Deleted

## 2014-04-15 DIAGNOSIS — N760 Acute vaginitis: Principal | ICD-10-CM

## 2014-04-15 DIAGNOSIS — B9689 Other specified bacterial agents as the cause of diseases classified elsewhere: Secondary | ICD-10-CM

## 2014-04-15 MED ORDER — METRONIDAZOLE 500 MG PO TABS: 500.0000 mg | ORAL_TABLET | Freq: Two times a day (BID) | ORAL | Status: DC

## 2014-04-15 NOTE — Telephone Encounter (Signed)
Jasmine Weber called and states she had a colposcopy 04/05/14 and since then has been having a bad odor that she didn't have before. States she read if she had a bad odor to call.  Per chart review did have colposcopy. Called Jasmine Weber and she states she had the coffee ground discharge for 4 days like they told her about, but is having a bad odor and a tan/brownish discharge. She denies having BV before but states is a bad odor. We discussed we could wait to see if it goes away, we could treat it , we could make appointment. She elects to treat for BV. We discussed if this does not help her symptoms or symptoms worsen she will need to call and make an appointment to be seen as it could be another infection. She voices understanding.

## 2014-11-13 ENCOUNTER — Encounter (HOSPITAL_BASED_OUTPATIENT_CLINIC_OR_DEPARTMENT_OTHER): Payer: Self-pay | Admitting: Emergency Medicine

## 2014-11-13 ENCOUNTER — Emergency Department (HOSPITAL_BASED_OUTPATIENT_CLINIC_OR_DEPARTMENT_OTHER)
Admission: EM | Admit: 2014-11-13 | Discharge: 2014-11-14 | Disposition: A | Discharge status: Home / Self Care | Payer: Self-pay | Attending: Emergency Medicine | Admitting: Emergency Medicine

## 2014-11-13 ENCOUNTER — Emergency Department (HOSPITAL_BASED_OUTPATIENT_CLINIC_OR_DEPARTMENT_OTHER): Payer: Self-pay

## 2014-11-13 DIAGNOSIS — W108XXA Fall (on) (from) other stairs and steps, initial encounter: Secondary | ICD-10-CM | POA: Insufficient documentation

## 2014-11-13 DIAGNOSIS — Y9389 Activity, other specified: Secondary | ICD-10-CM | POA: Insufficient documentation

## 2014-11-13 DIAGNOSIS — S39012A Strain of muscle, fascia and tendon of lower back, initial encounter: Secondary | ICD-10-CM | POA: Insufficient documentation

## 2014-11-13 DIAGNOSIS — Z8639 Personal history of other endocrine, nutritional and metabolic disease: Secondary | ICD-10-CM | POA: Insufficient documentation

## 2014-11-13 DIAGNOSIS — Z87891 Personal history of nicotine dependence: Secondary | ICD-10-CM | POA: Insufficient documentation

## 2014-11-13 DIAGNOSIS — Y9289 Other specified places as the place of occurrence of the external cause: Secondary | ICD-10-CM | POA: Insufficient documentation

## 2014-11-13 DIAGNOSIS — Z3202 Encounter for pregnancy test, result negative: Secondary | ICD-10-CM | POA: Insufficient documentation

## 2014-11-13 DIAGNOSIS — Y998 Other external cause status: Secondary | ICD-10-CM | POA: Insufficient documentation

## 2014-11-13 DIAGNOSIS — Z79899 Other long term (current) drug therapy: Secondary | ICD-10-CM | POA: Insufficient documentation

## 2014-11-13 LAB — PREGNANCY, URINE: PREG TEST UR: NEGATIVE

## 2014-11-13 LAB — URINALYSIS, ROUTINE W REFLEX MICROSCOPIC
Bilirubin Urine: NEGATIVE
GLUCOSE, UA: NEGATIVE mg/dL
HGB URINE DIPSTICK: NEGATIVE
KETONES UR: NEGATIVE mg/dL
Nitrite: NEGATIVE
PROTEIN: NEGATIVE mg/dL
Specific Gravity, Urine: 1.013 (ref 1.005–1.030)
UROBILINOGEN UA: 0.2 mg/dL (ref 0.0–1.0)
pH: 8 (ref 5.0–8.0)

## 2014-11-13 LAB — URINE MICROSCOPIC-ADD ON

## 2014-11-13 NOTE — ED Notes (Signed)
Patient states that she fell down steps today  about 5 and landed on her feet. The patient reports that she is having pain in her lower back

## 2014-11-14 MED ORDER — HYDROCODONE-ACETAMINOPHEN 5-325 MG PO TABS: 1.0000 | ORAL_TABLET | Freq: Four times a day (QID) | ORAL | Status: DC | PRN

## 2014-11-14 MED ORDER — CYCLOBENZAPRINE HCL 10 MG PO TABS: 10.0000 mg | ORAL_TABLET | Freq: Three times a day (TID) | ORAL | Status: DC | PRN

## 2014-11-14 NOTE — ED Provider Notes (Addendum)
CSN: [id number]     Arrival date & time 11/13/14  2142 History   First MD Initiated Contact with Patient 11/14/14 0125     Chief Complaint  Patient presents with  . Fall     (Consider location/radiation/quality/duration/timing/severity/associated sxs/prior Treatment) HPI  This is a 44 year old female who fell down about 6 stairs at work yesterday afternoon about 3:30 PM. She landed on her feet. She did not have any immediate pain but subsequently developed lower midline lumbar pain. The pain is worse with movement or weightbearing. There is no associated numbness or weakness. She denies head injury or neck pain. He describes the pain as a 10 out of 10 and characterizes it as pressure.  Past Medical History  Diagnosis Date  . Hypercholesteremia    Past Surgical History  Procedure Laterality Date  . Tubal ligation     Family History  Problem Relation Age of Onset  . Heart failure Mother   . Hypertension Mother   . Cancer Father     Prostate  . Diabetes Maternal Grandmother   . Cancer Maternal Grandfather    Social History  Substance Use Topics  . Smoking status: Former Research scientist (life sciences)  . Smokeless tobacco: Never Used  . Alcohol Use: Yes     Comment: rarely   OB History    Gravida Para Term Preterm AB TAB SAB Ectopic Multiple Living   5 3 3  2 2    3      Review of Systems  All other systems reviewed and are negative.   Allergies  Review of patient's allergies indicates no known allergies.  Home Medications   Prior to Admission medications   Medication Sig Start Date End Date Taking? Authorizing Provider  HYDROcodone-acetaminophen (NORCO/VICODIN) 5-325 MG per tablet Take 2 tablets by mouth every 4 (four) hours as needed. Patient not taking: Reported on 03/03/2014 10/02/13   Fransico Meadow, PA-C  ibuprofen (ADVIL,MOTRIN) 800 MG tablet Take 1 tablet (800 mg total) by mouth 3 (three) times daily. Patient not taking: Reported on 03/03/2014 10/02/13   Fransico Meadow, PA-C   lansoprazole (PREVACID) 30 MG capsule Take 1 capsule (30 mg total) by mouth daily. 07/18/11 07/17/12  Fransico Meadow, PA-C  metroNIDAZOLE (FLAGYL) 500 MG tablet Take 1 tablet (500 mg total) by mouth 2 (two) times daily. 04/15/14   Woodroe Mode, MD  ranitidine (ZANTAC) 150 MG tablet Take 1 tablet (150 mg total) by mouth 2 (two) times daily. 07/07/11 07/06/12  Glendell Docker, NP   BP 121/72 mmHg  Pulse 70  Temp(Src) 98.2 F (36.8 C) (Oral)  Resp 16  Ht 5\' 3"  (1.6 m)  Wt 188 lb (85.276 kg)  BMI 33.31 kg/m2  SpO2 100%  LMP 10/25/2014   Physical Exam  General: Well-developed, well-nourished female in no acute distress; appearance consistent with age of record HENT: normocephalic; atraumatic Eyes: pupils equal, round and reactive to light; extraocular muscles intact Neck: supple; nontender Heart: regular rate and rhythm Lungs: clear to auscultation bilaterally Chest: Nontender Abdomen: soft; nondistended; nontender; bowel sounds present Back: Mild left scapular tenderness without deformity or crepitus; lower lumbar spine tenderness; positive straight leg raise bilaterally at 45 Extremities: No deformity; full range of motion; pulses normal Neurologic: Awake, alert and oriented; motor function intact in all extremities and symmetric; no facial droop Skin: Warm and dry Psychiatric: Normal mood and affect    ED Course  Procedures (including critical care time)   MDM   Nursing notes and vitals signs,  including pulse oximetry, reviewed.  Summary of this visit's results, reviewed by myself:  Labs:  Results for orders placed or performed during the hospital encounter of 11/13/14 (from the past 24 hour(s))  Pregnancy, urine     Status: None   Collection Time: 11/13/14 10:10 PM  Result Value Ref Range   Preg Test, Ur NEGATIVE NEGATIVE  Urinalysis, Routine w reflex microscopic (not at [facility])     Status: Abnormal   Collection Time: 11/13/14 10:10 PM  Result Value Ref Range   Color,  Urine YELLOW YELLOW   APPearance CLOUDY (A) CLEAR   Specific Gravity, Urine 1.013 1.005 - 1.030   pH 8.0 5.0 - 8.0   Glucose, UA NEGATIVE NEGATIVE mg/dL   Hgb urine dipstick NEGATIVE NEGATIVE   Bilirubin Urine NEGATIVE NEGATIVE   Ketones, ur NEGATIVE NEGATIVE mg/dL   Protein, ur NEGATIVE NEGATIVE mg/dL   Urobilinogen, UA 0.2 0.0 - 1.0 mg/dL   Nitrite NEGATIVE NEGATIVE   Leukocytes, UA TRACE (A) NEGATIVE  Urine microscopic-add on     Status: Abnormal   Collection Time: 11/13/14 10:10 PM  Result Value Ref Range   Squamous Epithelial / LPF MANY (A) RARE   WBC, UA 3-6 <3 WBC/hpf   RBC / HPF 0-2 <3 RBC/hpf   Bacteria, UA FEW (A) RARE   Urine-Other MUCOUS PRESENT     Imaging Studies: Dg Lumbar Spine Complete  11/13/2014   CLINICAL DATA:  Fall down steps today with lower back pain. Initial encounter.  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 No evidence of acute fracture or subluxation. Mild spondylotic spurring without notable disc narrowing.  IMPRESSION: No fracture or subluxation.   Electronically Signed   By: Monte Fantasia M.D.   On: 11/13/2014 23:58       Shanon Rosser, MD 11/14/14 6789  Shanon Rosser, MD 11/14/14 [phone]

## 2015-03-09 ENCOUNTER — Telehealth (HOSPITAL_COMMUNITY): Payer: Self-pay | Admitting: *Deleted

## 2015-03-09 NOTE — Telephone Encounter (Signed)
Attempted to call patient to schedule follow up appointment with [facility]. No one answered phone. Left voicemail for patient to call me back.

## 2015-06-22 ENCOUNTER — Emergency Department (HOSPITAL_BASED_OUTPATIENT_CLINIC_OR_DEPARTMENT_OTHER)
Admission: EM | Admit: 2015-06-22 | Discharge: 2015-06-22 | Disposition: A | Discharge status: Home / Self Care | Payer: BLUE CROSS/BLUE SHIELD | Attending: Emergency Medicine | Admitting: Emergency Medicine

## 2015-06-22 ENCOUNTER — Emergency Department (HOSPITAL_BASED_OUTPATIENT_CLINIC_OR_DEPARTMENT_OTHER): Payer: BLUE CROSS/BLUE SHIELD

## 2015-06-22 ENCOUNTER — Encounter (HOSPITAL_BASED_OUTPATIENT_CLINIC_OR_DEPARTMENT_OTHER): Payer: Self-pay | Admitting: Emergency Medicine

## 2015-06-22 DIAGNOSIS — Z87891 Personal history of nicotine dependence: Secondary | ICD-10-CM | POA: Insufficient documentation

## 2015-06-22 DIAGNOSIS — Y9301 Activity, walking, marching and hiking: Secondary | ICD-10-CM | POA: Insufficient documentation

## 2015-06-22 DIAGNOSIS — Y9289 Other specified places as the place of occurrence of the external cause: Secondary | ICD-10-CM | POA: Diagnosis not present

## 2015-06-22 DIAGNOSIS — W228XXA Striking against or struck by other objects, initial encounter: Secondary | ICD-10-CM | POA: Insufficient documentation

## 2015-06-22 DIAGNOSIS — S6992XA Unspecified injury of left wrist, hand and finger(s), initial encounter: Secondary | ICD-10-CM | POA: Diagnosis present

## 2015-06-22 DIAGNOSIS — Z8639 Personal history of other endocrine, nutritional and metabolic disease: Secondary | ICD-10-CM | POA: Diagnosis not present

## 2015-06-22 DIAGNOSIS — Y998 Other external cause status: Secondary | ICD-10-CM | POA: Diagnosis not present

## 2015-06-22 DIAGNOSIS — S60212A Contusion of left wrist, initial encounter: Secondary | ICD-10-CM

## 2015-06-22 MED ORDER — IBUPROFEN 800 MG PO TABS
800.0000 mg | ORAL_TABLET | Freq: Once | ORAL | Status: AC
  Administered 2015-06-22: 800 mg via ORAL
  Filled 2015-06-22: qty 1

## 2015-06-22 MED ORDER — HYDROCODONE-ACETAMINOPHEN 5-325 MG PO TABS: 1.0000 | ORAL_TABLET | Freq: Four times a day (QID) | ORAL | Status: DC | PRN

## 2015-06-22 NOTE — Discharge Instructions (Signed)
You were seen today for swelling of the left wrist. You have a hematoma there. There is concern that there may be a fracture that is not visible on x-ray. You'll be placed in a splint. You need to follow-up in one week for repeat x-rays.  Scaphoid Fracture, Wrist A fracture is a break in the bone. The bone you have broken often does not show up as a fracture on x-ray until later on in the healing phase. This bone is called the scaphoid bone. With this bone, your caregiver will often cast or splint your wrist as though it is fractured, even if a fracture is not seen on the x-ray. This is often done with wrist injuries in which there is tenderness at the base of the thumb. An x-ray at 1-3 weeks after your injury may confirm this fracture. A cast or splint is used to protect and keep your injured bone in good position for healing. The cast or splint will be on generally for about 6 to 16 weeks, depending on your health, age, the fracture location and how quickly you heal. Another name for the scaphoid bone is the navicular bone. HOME CARE INSTRUCTIONS   To lessen the swelling and pain, keep the injured part elevated above your heart while sitting or lying down.  Apply ice to the injury for 15-20 minutes, 03-04 times per day while awake, for 2 days. Put the ice in a plastic bag and place a thin towel between the bag of ice and your cast.  If you have a plaster or fiberglass cast or splint:  Do not try to scratch the skin under the cast using sharp or pointed objects.  Check the skin around the cast every day. You may put lotion on any red or sore areas.  Keep your cast or splint dry and clean.  If you have a plaster splint:  Wear the splint as directed.  You may loosen the elastic bandage around the splint if your fingers become numb, tingle, or turn cold or blue.  If you have been put in a removable splint, wear and use as directed.  Do not put pressure on any part of your cast or splint; it  may deform or break. Rest your cast or splint only on a pillow the first 24 hours until it is fully hardened.  Your cast or splint can be protected during bathing with a plastic bag. Do not lower the cast or splint into water.  Only take over-the-counter or prescription medicines for pain, discomfort, or fever as directed by your caregiver.  If your caregiver has given you a follow up appointment, it is very important to keep that appointment. Not keeping the appointment could result in chronic pain and decreased function. If there is any problem keeping the appointment, you must call back to this facility for assistance. SEEK IMMEDIATE MEDICAL CARE IF:   Your cast gets damaged, wet or breaks.  You have continued severe pain or more swelling than you did before the cast or splint was put on.  Your skin or nails below the injury turn blue or gray, or feel cold or numb.  You have tingling or burning pain in your fingers or increasing pain with movement of your fingers   This information is not intended to replace advice given to you by your health care provider. Make sure you discuss any questions you have with your health care provider.   Document Released: 03/02/2002 Document Revised: 06/04/2011 Document Reviewed:  09/22/2014 Elsevier Interactive Patient Education 2016 Else[employer]   Hematoma A hematoma is a collection of blood under the skin, in an organ, in a body space, in a joint space, or in other tissue. The blood can clot to form a lump that you can see and feel. The lump is often firm and may sometimes become sore and tender. Most hematomas get better in a few days to weeks. However, some hematomas may be serious and require medical care. Hematomas can range in size from very small to very large. CAUSES  A hematoma can be caused by a blunt or penetrating injury. It can also be caused by spontaneous leakage from a blood vessel under the skin. Spontaneous leakage from a blood vessel  is more likely to occur in older people, especially those taking blood thinners. Sometimes, a hematoma can develop after certain medical procedures. SIGNS AND SYMPTOMS   A firm lump on the body.  Possible pain and tenderness in the area.  Bruising.Blue, dark blue, purple-red, or yellowish skin may appear at the site of the hematoma if the hematoma is close to the surface of the skin. For hematomas in deeper tissues or body spaces, the signs and symptoms may be subtle. For example, an intra-abdominal hematoma may cause abdominal pain, weakness, fainting, and shortness of breath. An intracranial hematoma may cause a headache or symptoms such as weakness, trouble speaking, or a change in consciousness. DIAGNOSIS  A hematoma can usually be diagnosed based on your medical history and a physical exam. Imaging tests may be needed if your health care provider suspects a hematoma in deeper tissues or body spaces, such as the abdomen, head, or chest. These tests may include ultrasonography or a CT scan.  TREATMENT  Hematomas usually go away on their own over time. Rarely does the blood need to be drained out of the body. Large hematomas or those that may affect vital organs will sometimes need surgical drainage or monitoring. HOME CARE INSTRUCTIONS   Apply ice to the injured area:   Put ice in a plastic bag.   Place a towel between your skin and the bag.   Leave the ice on for 20 minutes, 2-3 times a day for the first 1 to 2 days.   After the first 2 days, switch to using warm compresses on the hematoma.   Elevate the injured area to help decrease pain and swelling. Wrapping the area with an elastic bandage may also be helpful. Compression helps to reduce swelling and promotes shrinking of the hematoma. Make sure the bandage is not wrapped too tight.   If your hematoma is on a lower extremity and is painful, crutches may be helpful for a couple days.   Only take over-the-counter or  prescription medicines as directed by your health care provider. SEEK IMMEDIATE MEDICAL CARE IF:   You have increasing pain, or your pain is not controlled with medicine.   You have a fever.   You have worsening swelling or discoloration.   Your skin over the hematoma breaks or starts bleeding.   Your hematoma is in your chest or abdomen and you have weakness, shortness of breath, or a change in consciousness.  Your hematoma is on your scalp (caused by a fall or injury) and you have a worsening headache or a change in alertness or consciousness. MAKE SURE YOU:   Understand these instructions.  Will watch your condition.  Will get help right away if you are not doing well or  get worse.   This information is not intended to replace advice given to you by your health care provider. Make sure you discuss any questions you have with your health care provider.   Document Released: 10/25/2003 Document Revised: 11/12/2012 Document Reviewed: 08/20/2012 Elsevier Interactive Patient Education [employer].

## 2015-06-22 NOTE — ED Notes (Signed)
Hit left wrist on door frame Sunday pm  Increase pain and swollen area

## 2015-06-22 NOTE — ED Notes (Signed)
Pt reports while walking she accidently struck her left wrist against door causing pain and swelling.

## 2015-06-22 NOTE — ED Provider Notes (Signed)
CSN: [id number]     Arrival date & time 06/22/15  [id number] History   First MD Initiated Contact with Patient 06/22/15 [phone]     Chief Complaint  Patient presents with  . Wrist Pain     (Consider location/radiation/quality/duration/timing/severity/associated sxs/prior Treatment) HPI  This is a 45 year old female who presents with left wrist pain and swelling. Patient reports that she hit her left wrist on a doorway on Sunday evening. She's had increasing pain and swelling. She reports that she has used ice which is not improved the swelling.  Current pain is 7 out of 10. She reports normal range of motion with slight worsening of pain with range of motion of the wrist.  Past Medical History  Diagnosis Date  . Hypercholesteremia    Past Surgical History  Procedure Laterality Date  . Tubal ligation     Family History  Problem Relation Age of Onset  . Heart failure Mother   . Hypertension Mother   . Cancer Father     Prostate  . Diabetes Maternal Grandmother   . Cancer Maternal Grandfather    Social History  Substance Use Topics  . Smoking status: Former Research scientist (life sciences)  . Smokeless tobacco: Never Used  . Alcohol Use: Yes     Comment: rarely   OB History    Gravida Para Term Preterm AB TAB SAB Ectopic Multiple Living   5 3 3  2 2    3      Review of Systems  Musculoskeletal:       Left wrist pain  Skin: Positive for wound.  Neurological: Negative for weakness and numbness.  All other systems reviewed and are negative.     Allergies  Review of patient's allergies indicates no known allergies.  Home Medications   Prior to Admission medications   Medication Sig Start Date End Date Taking? Authorizing Provider  cyclobenzaprine (FLEXERIL) 10 MG tablet Take 1 tablet (10 mg total) by mouth 3 (three) times daily as needed for muscle spasms. 11/14/14   John Molpus, MD  HYDROcodone-acetaminophen (NORCO/VICODIN) 5-325 MG tablet Take 1 tablet by mouth every 6 (six) hours as needed.  06/22/15   Merryl Hacker, MD   BP 153/89 mmHg  Pulse 91  Resp 18  Ht 5\' 7"  (1.702 m)  Wt 185 lb (83.915 kg)  BMI 28.97 kg/m2  SpO2 100%  LMP 06/18/2015 Physical Exam  Constitutional: She is oriented to person, place, and time. She appears well-developed and well-nourished.  HENT:  Head: Normocephalic and atraumatic.  Cardiovascular: Normal rate and regular rhythm.   Pulmonary/Chest: Effort normal. No respiratory distress.  Musculoskeletal:  Swelling noted over the lateral aspect of the left wrist just distal to the radial head, no overlying skin changes, positive snuffbox tenderness, normal range of motion, 2+ radial pulse  Neurological: She is alert and oriented to person, place, and time.  Skin: Skin is warm and dry.  Psychiatric: She has a normal mood and affect.  Nursing note and vitals reviewed.   ED Course  Procedures (including critical care time) Labs Review Labs Reviewed - No data to display  Imaging Review Dg Wrist Complete Left  06/22/2015  CLINICAL DATA:  Struck left wrist on door jamb, with pain and swelling about the wrist. Initial encounter. EXAM: LEFT WRIST - COMPLETE 3+ VIEW COMPARISON:  Left hand radiograph performed 06/15/2015 FINDINGS: There is no evidence of fracture or dislocation. The carpal rows are intact, and demonstrate normal alignment. The joint spaces are preserved. A prominent soft  tissue hematoma is noted overlying the scaphoid. IMPRESSION: 1. No evidence of fracture or dislocation. 2. Prominent soft tissue hematoma overlying the scaphoid. Electronically Signed   By: Garald Balding M.D.   On: 06/22/2015 05:10   I have personally reviewed and evaluated these images and lab results as part of my medical decision-making.   EKG Interpretation None      MDM   Final diagnoses:  Traumatic hematoma of wrist, left, initial encounter   Patient presents with injury to left wrist. 2 days ago. Increasing swelling. She has tenderness in the snuffbox  and obvious hematoma. Normal range of motion. X-rays are negative but do note swelling and hematoma adjacent to the scaphoid. Given snuffbox tenderness and x-ray findings, will treat as an occult scaphoid fracture. Patient placed in a thumb spica. Follow-up with sports medicine in one week for repeat x-rays.  After history, exam, and medical workup I feel the patient has been appropriately medically screened and is safe for discharge home. Pertinent diagnoses were discussed with the patient. Patient was given return precautions.     Merryl Hacker, MD 06/22/15 [phone]

## 2015-07-01 ENCOUNTER — Ambulatory Visit (INDEPENDENT_AMBULATORY_CARE_PROVIDER_SITE_OTHER): Payer: BLUE CROSS/BLUE SHIELD | Admitting: Family Medicine

## 2015-07-01 ENCOUNTER — Encounter: Payer: Self-pay | Admitting: Family Medicine

## 2015-07-01 ENCOUNTER — Ambulatory Visit (HOSPITAL_BASED_OUTPATIENT_CLINIC_OR_DEPARTMENT_OTHER)
Admission: RE | Admit: 2015-07-01 | Discharge: 2015-07-01 | Disposition: A | Discharge status: Home / Self Care | Payer: BLUE CROSS/BLUE SHIELD | Source: Ambulatory Visit | Attending: Family Medicine | Admitting: Family Medicine

## 2015-07-01 VITALS — BP 129/74 | HR 75 | Ht 67.0 in | Wt 185.0 lb

## 2015-07-01 DIAGNOSIS — S6992XA Unspecified injury of left wrist, hand and finger(s), initial encounter: Secondary | ICD-10-CM | POA: Diagnosis present

## 2015-07-01 DIAGNOSIS — R2232 Localized swelling, mass and lump, left upper limb: Secondary | ICD-10-CM | POA: Diagnosis not present

## 2015-07-01 DIAGNOSIS — S60212A Contusion of left wrist, initial encounter: Secondary | ICD-10-CM | POA: Diagnosis not present

## 2015-07-01 DIAGNOSIS — X58XXXA Exposure to other specified factors, initial encounter: Secondary | ICD-10-CM | POA: Insufficient documentation

## 2015-07-01 NOTE — Patient Instructions (Signed)
You have a traumatic hematoma. Your repeat x-rays look good. These typically go away on their own after several weeks. I wouldn't recommend draining this unless it was extremely painful because it holds the risk of infection and bleeding with doing that and they tend to recollect. Icing 15 minutes at a time at minimum 4 times a day. Ibuprofen 600mg  three times a day with food OR aleve 2 tabs twice a day with food for pain and inflammation. Elevate above your heart when possible. Try ACE wrap for compression. Brace is a consideration but I don't think you need this. Follow up with me in 5 weeks if you're still having problems.

## 2015-07-04 DIAGNOSIS — S60212A Contusion of left wrist, initial encounter: Secondary | ICD-10-CM | POA: Insufficient documentation

## 2015-07-04 NOTE — Progress Notes (Signed)
PCP:  [facility]  Subjective:   HPI: Patient is a 45 y.o. female here for left wrist injury.  Patient reports on 3/27 she swung her left arm and hit dorsal aspect of her left wrist on a door. Immediate swelling, tenderness to area. Pain level now 6/10, sharp dorsally. Taken ibuprofen, elevated, icing. No prior injuries. Initial radiographs negative for fracture. Right handed. No skin changes, fever, other complaints  Past Medical History  Diagnosis Date  . Hypercholesteremia     Current Outpatient Prescriptions on File Prior to Visit  Medication Sig Dispense Refill  . HYDROcodone-acetaminophen (NORCO/VICODIN) 5-325 MG tablet Take 1 tablet by mouth every 6 (six) hours as needed. 10 tablet 0  . [DISCONTINUED] lansoprazole (PREVACID) 30 MG capsule Take 1 capsule (30 mg total) by mouth daily. 30 capsule 0  . [DISCONTINUED] ranitidine (ZANTAC) 150 MG tablet Take 1 tablet (150 mg total) by mouth 2 (two) times daily. 30 tablet 0   No current facility-administered medications on file prior to visit.    Past Surgical History  Procedure Laterality Date  . Tubal ligation      No Known Allergies  Social History   Social History  . Marital Status: Single    Spouse Name: N/A  . Number of Children: N/A  . Years of Education: N/A   Occupational History  . Not on file.   Social History Main Topics  . Smoking status: Former Research scientist (life sciences)  . Smokeless tobacco: Never Used  . Alcohol Use: Yes     Comment: rarely  . Drug Use: No  . Sexual Activity: Not Currently    Birth Control/ Protection: Surgical   Other Topics Concern  . Not on file   Social History Narrative    Family History  Problem Relation Age of Onset  . Heart failure Mother   . Hypertension Mother   . Cancer Father     Prostate  . Diabetes Maternal Grandmother   . Cancer Maternal Grandfather     BP 129/74 mmHg  Pulse 75  Ht 5\' 7"  (1.702 m)  Wt 185 lb (83.915 kg)  BMI 28.97 kg/m2  LMP  06/18/2015  Review of Systems: See HPI above.    Objective:  Physical Exam:  Gen: NAD, comfortable in exam room  Left wrist: Focal swollen area about 1.5cm in diameter dorsally more radial of wrist.  No bruising, other deformity. TTP on this area but no other tenderness of wrist or digits. FROM digits without pain.  Extension limited mildly and painful of wrist.  Full flexion. Sensation intact to light touch distally. Cap refill < 2 sec.  MSK u/s left wrist:  Confirms a hypoechoic 1.46cm depth x 1.94cm length mass consistent with hematoma.  No communication into joint visible.  Surrounding tendons intact.  No vascularity in or surrounding mass.    Assessment & Plan:  1. Left wrist hematoma - reassured patient.  Advised icing, nsaids, elevation, compression for this.  Would recommend against drainage.  Wrist brace for symptom relief and to allow this to rest.  F/u in 5 weeks if still having problems.

## 2015-07-04 NOTE — Assessment & Plan Note (Signed)
reassured patient.  Advised icing, nsaids, elevation, compression for this.  Would recommend against drainage.  Wrist brace for symptom relief and to allow this to rest.  F/u in 5 weeks if still having problems.

## 2015-07-06 ENCOUNTER — Telehealth: Payer: Self-pay | Admitting: Family Medicine

## 2015-07-06 NOTE — Telephone Encounter (Signed)
Not needed

## 2015-10-12 ENCOUNTER — Encounter (HOSPITAL_COMMUNITY): Payer: Self-pay | Admitting: *Deleted

## 2016-08-19 ENCOUNTER — Encounter (HOSPITAL_BASED_OUTPATIENT_CLINIC_OR_DEPARTMENT_OTHER): Payer: Self-pay | Admitting: Emergency Medicine

## 2016-08-19 ENCOUNTER — Emergency Department (HOSPITAL_BASED_OUTPATIENT_CLINIC_OR_DEPARTMENT_OTHER)
Admission: EM | Admit: 2016-08-19 | Discharge: 2016-08-19 | Disposition: A | Discharge status: Home / Self Care | Payer: BLUE CROSS/BLUE SHIELD | Attending: Emergency Medicine | Admitting: Emergency Medicine

## 2016-08-19 DIAGNOSIS — Z79899 Other long term (current) drug therapy: Secondary | ICD-10-CM | POA: Insufficient documentation

## 2016-08-19 DIAGNOSIS — M549 Dorsalgia, unspecified: Secondary | ICD-10-CM | POA: Insufficient documentation

## 2016-08-19 DIAGNOSIS — Z87891 Personal history of nicotine dependence: Secondary | ICD-10-CM | POA: Diagnosis not present

## 2016-08-19 DIAGNOSIS — Y999 Unspecified external cause status: Secondary | ICD-10-CM | POA: Diagnosis not present

## 2016-08-19 DIAGNOSIS — Y939 Activity, unspecified: Secondary | ICD-10-CM | POA: Diagnosis not present

## 2016-08-19 DIAGNOSIS — S3992XA Unspecified injury of lower back, initial encounter: Secondary | ICD-10-CM | POA: Diagnosis present

## 2016-08-19 DIAGNOSIS — Y929 Unspecified place or not applicable: Secondary | ICD-10-CM | POA: Insufficient documentation

## 2016-08-19 MED ORDER — IBUPROFEN 800 MG PO TABS: 800.0000 mg | ORAL_TABLET | Freq: Three times a day (TID) | ORAL | 0 refills | Status: DC | PRN

## 2016-08-19 MED ORDER — LIDOCAINE 5 % EX PTCH: 1.0000 | MEDICATED_PATCH | CUTANEOUS | 0 refills | Status: DC

## 2016-08-19 MED ORDER — METHOCARBAMOL 500 MG PO TABS: 500.0000 mg | ORAL_TABLET | Freq: Four times a day (QID) | ORAL | 0 refills | Status: DC | PRN
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
## 2016-08-19 NOTE — Discharge Instructions (Signed)
Read the information below.  Use the prescribed medication as directed.  Please discuss all new medications with your pharmacist.  You may return to the Emergency Department at any time for worsening condition or any new symptoms that concern you.    If you develop fevers, loss of control of bowel or bladder, weakness or numbness in your legs, or are unable to walk, return to the ER for a recheck.  °

## 2017-06-01 ENCOUNTER — Encounter (HOSPITAL_BASED_OUTPATIENT_CLINIC_OR_DEPARTMENT_OTHER): Payer: Self-pay | Admitting: Emergency Medicine

## 2017-06-01 ENCOUNTER — Other Ambulatory Visit: Payer: Self-pay

## 2017-06-01 ENCOUNTER — Emergency Department (HOSPITAL_BASED_OUTPATIENT_CLINIC_OR_DEPARTMENT_OTHER)
Admission: EM | Admit: 2017-06-01 | Discharge: 2017-06-02 | Disposition: A | Discharge status: Home / Self Care | Payer: BLUE CROSS/BLUE SHIELD | Attending: Emergency Medicine | Admitting: Emergency Medicine

## 2017-06-01 ENCOUNTER — Emergency Department (HOSPITAL_BASED_OUTPATIENT_CLINIC_OR_DEPARTMENT_OTHER): Payer: BLUE CROSS/BLUE SHIELD

## 2017-06-01 DIAGNOSIS — Z87891 Personal history of nicotine dependence: Secondary | ICD-10-CM | POA: Insufficient documentation

## 2017-06-01 DIAGNOSIS — M79605 Pain in left leg: Secondary | ICD-10-CM | POA: Diagnosis not present

## 2017-06-01 DIAGNOSIS — R079 Chest pain, unspecified: Secondary | ICD-10-CM | POA: Diagnosis present

## 2017-06-01 DIAGNOSIS — M79604 Pain in right leg: Secondary | ICD-10-CM

## 2017-06-01 HISTORY — DX: Anxiety disorder, unspecified: F41.9

## 2017-06-01 LAB — CBC
HCT: 39.6 % (ref 36.0–46.0)
HEMOGLOBIN: 13.3 g/dL (ref 12.0–15.0)
MCH: 27.4 pg (ref 26.0–34.0)
MCHC: 33.6 g/dL (ref 30.0–36.0)
MCV: 81.6 fL (ref 78.0–100.0)
PLATELETS: 332 10*3/uL (ref 150–400)
RBC: 4.85 MIL/uL (ref 3.87–5.11)
RDW: 15.4 % (ref 11.5–15.5)
WBC: 8.7 10*3/uL (ref 4.0–10.5)

## 2017-06-01 LAB — D-DIMER, QUANTITATIVE (NOT AT ARMC): D DIMER QUANT: 0.54 ug{FEU}/mL — AB (ref 0.00–0.50)

## 2017-06-01 LAB — BASIC METABOLIC PANEL
ANION GAP: 9 (ref 5–15)
BUN: 13 mg/dL (ref 6–20)
CALCIUM: 8.4 mg/dL — AB (ref 8.9–10.3)
CO2: 21 mmol/L — AB (ref 22–32)
Chloride: 106 mmol/L (ref 101–111)
Creatinine, Ser: 0.72 mg/dL (ref 0.44–1.00)
Glucose, Bld: 84 mg/dL (ref 65–99)
Potassium: 4.1 mmol/L (ref 3.5–5.1)
SODIUM: 136 mmol/L (ref 135–145)

## 2017-06-01 LAB — TROPONIN I: Troponin I: <0.03 ng/mL (ref <0.03)

## 2017-06-01 LAB — PREGNANCY, URINE: PREG TEST UR: NEGATIVE

## 2017-06-01 MED ORDER — IOPAMIDOL (ISOVUE-370) INJECTION 76%
100.0000 mL | Freq: Once | INTRAVENOUS | Status: AC | PRN
  Administered 2017-06-01: 100 mL via INTRAVENOUS

## 2017-06-01 NOTE — ED Provider Notes (Signed)
[facility] EMERGENCY DEPARTMENT Provider Note   CSN: [id number] Arrival date & time: 06/01/17  1845     History   Chief Complaint Chief Complaint  Patient presents with  . Chest Pain  . Leg Pain    HPI Jasmine Weber is a 47 y.o. female possible history of anxiety, hypercholesteremia who presents for evaluation of 2 weeks of intermittent chest pain, bilateral lower extremity pain.  Patient reports that chest pain is intermittent and describes it as a "ache/discomfort."  He states it is worse with deep inspiration.  Patient also notes that the chest pain will come on and be worse if she tries to get up and move around and exert herself. She states that pain is not affected by position. She does not have any vomiting, diaphoresis.  Patient denies any difficulty breathing.  Additionally, patient reports that she has had 2 weeks of intermittent bilateral lower extremity pain that she describes as a "ache sensation."  He states that the pain is worse after a day of working where she has been standing on her feet all day long.  She has not had any difficulty ambulating.  She has not noticed any redness/swelling to either BLE. She has taken Tylenol for the pain with minimal improvement.  Patient also reports she has had some intermittent nausea but does not state is correlates with her episodes of chest pain.  Patient states that she smokes Black and mild.  Patient denies any personal cardiac history.  She denies any family cardiac history.  Patient denies any cocaine, heroin, marijuana use. She denies any OCP use, recent immobilization, prior history of DVT/PE, recent surgery, leg swelling, or long travel.  Patient denies any fevers, difficulty breathing, abdominal pain, numbness/weakness.  The history is provided by the patient.    Past Medical History:  Diagnosis Date  . Anxiety   . Hypercholesteremia     Patient Active Problem List   Diagnosis Date Noted  . Traumatic hematoma of  left wrist 07/04/2015  . Abnormal Pap smear of cervix 04/05/2014  . Obesity 04/05/2014    Past Surgical History:  Procedure Laterality Date  . TUBAL LIGATION      OB History    Gravida Para Term Preterm AB Living   5 3 3   2 3    SAB TAB Ectopic Multiple Live Births     2             Home Medications    Prior to Admission medications   Medication Sig Start Date End Date Taking? Authorizing Provider  HYDROcodone-acetaminophen (NORCO/VICODIN) 5-325 MG tablet Take 1 tablet by mouth every 6 (six) hours as needed. 06/22/15   Horton, Barbette Hair, MD  ibuprofen (ADVIL,MOTRIN) 800 MG tablet Take 1 tablet (800 mg total) by mouth every 8 (eight) hours as needed. 08/19/16   Clayton Bibles, PA-C  lidocaine (LIDODERM) 5 % Place 1 patch onto the skin daily. Remove & Discard patch within 12 hours or as directed by MD 08/19/16   Clayton Bibles, PA-C  methocarbamol (ROBAXIN) 500 MG tablet Take 1-2 tablets (500-1,000 mg total) by mouth every 6 (six) hours as needed for muscle spasms (and pain). 08/19/16   Clayton Bibles, PA-C  valACYclovir (VALTREX) 1000 MG tablet TK 1 T PO D 05/21/15   [provider]  zolpidem (AMBIEN) 10 MG tablet Take 10 mg by mouth at bedtime as needed for sleep.    [provider]  lansoprazole (PREVACID) 30 MG capsule Take 1  capsule (30 mg total) by mouth daily. 07/18/11 11/14/14  Fransico Meadow, PA-C  ranitidine (ZANTAC) 150 MG tablet Take 1 tablet (150 mg total) by mouth 2 (two) times daily. 07/07/11 11/14/14  Glendell Docker, NP    Family History Family History  Problem Relation Age of Onset  . Heart failure Mother   . Hypertension Mother   . Cancer Father        Prostate  . Diabetes Maternal Grandmother   . Cancer Maternal Grandfather     Social History Social History   Tobacco Use  . Smoking status: Former Research scientist (life sciences)  . Smokeless tobacco: Never Used  Substance Use Topics  . Alcohol use: Yes    Comment: rarely  . Drug use: No     Allergies   Patient  has no known allergies.   Review of Systems Review of Systems  Constitutional: Negative for chills and fever.  HENT: Negative for congestion.   Eyes: Negative for visual disturbance.  Respiratory: Negative for cough and shortness of breath.   Cardiovascular: Positive for chest pain.  Gastrointestinal: Positive for nausea. Negative for abdominal pain, diarrhea and vomiting.  Genitourinary: Negative for dysuria and hematuria.  Musculoskeletal: Negative for back pain and neck pain.       Leg pain  Skin: Negative for rash.  Neurological: Negative for dizziness, weakness, numbness and headaches.  Psychiatric/Behavioral: Negative for confusion.     Physical Exam Updated Vital Signs BP 127/83   Pulse 73   Temp 98.6 F (37 C) (Oral)   Resp 17   Ht 5\' 7"  (1.702 m)   Wt 88.9 kg (196 lb)   LMP 05/28/2017   SpO2 100%   BMI 30.70 kg/m   Physical Exam  Constitutional: She is oriented to person, place, and time. She appears well-developed and well-nourished.  Sitting comfortably on examination table  HENT:  Head: Normocephalic and atraumatic.  Mouth/Throat: Oropharynx is clear and moist and mucous membranes are normal.  Eyes: Conjunctivae, EOM and lids are normal. Pupils are equal, round, and reactive to light.  Neck: Full passive range of motion without pain.  Cardiovascular: Normal rate, regular rhythm, normal heart sounds and normal pulses. Exam reveals no gallop and no friction rub.  No murmur heard. Pulses:      Radial pulses are 2+ on the right side, and 2+ on the left side.       Dorsalis pedis pulses are 2+ on the right side, and 2+ on the left side.  Pulmonary/Chest: Effort normal and breath sounds normal.  Abdominal: Soft. Normal appearance. There is no tenderness. There is no rigidity and no guarding.  Musculoskeletal: Normal range of motion.  Lateral lower extremities are symmetric in appearance.  Neurological: She is alert and oriented to person, place, and time.    Cranial nerves III-XII intact Follows commands, Moves all extremities  5/5 strength to BUE and BLE  Sensation intact throughout all major nerve distributions Normal finger to nose. No dysdiadochokinesia. No pronator drift. No gait abnormalities  No slurred speech. No facial droop.   Skin: Skin is warm and dry. Capillary refill takes less than 2 seconds.  Good distal cap refill. BLE are not dusky in appearance or cool to touch.  Psychiatric: She has a normal mood and affect. Her speech is normal.  Nursing note and vitals reviewed.    ED Treatments / Results  Labs (all labs ordered are listed, but only abnormal results are displayed) Labs Reviewed  BASIC METABOLIC PANEL - Abnormal;  Notable for the following components:      Result Value   CO2 21 (*)    Calcium 8.4 (*)    All other components within normal limits  D-DIMER, QUANTITATIVE (NOT AT [facility]) - Abnormal; Notable for the following components:   D-Dimer, Quant 0.54 (*)    All other components within normal limits  PREGNANCY, URINE  CBC  TROPONIN I  TROPONIN I    EKG  EKG Interpretation  Date/Time:  Saturday June 01 2017 18:47:43 EST Ventricular Rate:  75 PR Interval:  134 QRS Duration: 92 QT Interval:  406 QTC Calculation: 453 R Axis:   20 Text Interpretation:  Normal sinus rhythm Low voltage QRS Incomplete right bundle branch block Nonspecific T wave abnormality Abnormal ECG T wave flattening similar to Aug 2015 Confirmed by Sherwood Gambler [phone]) on 06/01/2017 6:52:20 PM       Radiology Dg Chest 2 View  Result Date: 06/01/2017 CLINICAL DATA:  Midsternal chest pain for several weeks with shortness of Breath EXAM: CHEST - 2 VIEW COMPARISON:  03/25/2017 FINDINGS: The heart size and mediastinal contours are within normal limits. Both lungs are clear. The visualized skeletal structures are unremarkable. IMPRESSION: No active cardiopulmonary disease. Electronically Signed   By: Inez Catalina M.D.   On: 06/01/2017  19:25   Ct Angio Chest Pe W And/or Wo Contrast  Result Date: 06/01/2017 CLINICAL DATA:  Subacute onset of midsternal chest pain. Mild shortness of breath. EXAM: CT ANGIOGRAPHY CHEST WITH CONTRAST TECHNIQUE: Multidetector CT imaging of the chest was performed using the standard protocol during bolus administration of intravenous contrast. Multiplanar CT image reconstructions and MIPs were obtained to evaluate the vascular anatomy. CONTRAST:  196mL ISOVUE-370 IOPAMIDOL (ISOVUE-370) INJECTION 76% COMPARISON:  Chest radiograph performed earlier today at 7:03 p.m. FINDINGS: Cardiovascular:  There is no evidence of pulmonary embolus. The heart is normal in size. The thoracic aorta is unremarkable. The great vessels are within normal limits. Mediastinum/Nodes: The mediastinum is unremarkable appearance. No mediastinal lymphadenopathy is seen. No pericardial effusion is identified. The visualized portions of the thyroid gland are unremarkable. No axillary lymphadenopathy is appreciated. Lungs/Pleura: The lungs are clear bilaterally. No focal consolidation, pleural effusion or pneumothorax is seen. No masses are identified. Upper Abdomen: The visualized portions of the liver and spleen are unremarkable. The visualized portions of the pancreas, adrenal glands and kidneys are within normal limits. Musculoskeletal: No acute osseous abnormalities are identified. The visualized musculature is unremarkable in appearance. Review of the MIP images confirms the above findings. IMPRESSION: No evidence of pulmonary embolus.  Lungs clear bilaterally. Electronically Signed   By: Garald Balding M.D.   On: 06/01/2017 23:04    Procedures Procedures (including critical care time)  Medications Ordered in ED Medications  iopamidol (ISOVUE-370) 76 % injection 100 mL (100 mLs Intravenous Contrast Given 06/01/17 2235)     Initial Impression / Assessment and Plan / ED Course  I have reviewed the triage vital signs and the nursing  notes.  Pertinent labs & imaging results that were available during my care of the patient were reviewed by me and considered in my medical decision making (see chart for details).     47 year old female who presents for evaluation of 2 weeks of intermittent chest pain.  Worse with deep inspiration.  This is sometimes worse with exertion.  Not associate with pheresis, vomiting.  Does not know anything that triggers pain.  No difficulty breathing but pain is worse with deep inspiration.  Also reports intermittent  pain to bilateral lower extremities.  Pain is worsened after she has been on her feet and working all day.  No redness, swelling to the feet. Patient is afebrile, non-toxic appearing, sitting comfortably on examination table. Vital signs reviewed and stable.  Consider musculoskeletal pain versus anxiety versus ACS etiology versus acute infectious etiology vs electrolyte imbalance.  Low suspicion for PE but given history/physical. But given Patient's complaints of symptoms, will check d-dimer.  Leg exam is not concerning for DVT, septic arthritis, acute arterial embolism.  Patient may have some degree of chronic venous insufficiency or venous claudication symptoms that is causing her symptoms, particularly since she notes that is worse after being on her feet all day.  History/physical exam are not concerning for aortic dissection or pericarditis.  Initial labs ordered at triage.  Chest x-ray reviewed.  Negative for any acute abnormality.  EKG as documented above.  Urine pregnancy is negative.  Initial troponin is negative.  CBC is without any leukocytosis or anemia.  BMP is unremarkable.  D-dimer is positive.  Plan for CTA of chest.  CT of the chest is negative for any acute pulmonary embolus.  Discussed results with patient.  Given patient's risk factors, presentation, she has a heart score of 3.  We will plan to delta troponin.  Delta troponin negative.  Even that this pain has been ongoing  for the last 2 weeks, do not feel that patient needs further observation in the hospital.  Discussed results with patient. Vitals are stable.  I encouraged patient to follow-up with her primary care doctor regarding her symptoms. Patient had ample opportunity for questions and discussion. All patient's questions were answered with full understanding. Strict return precautions discussed. Patient expresses understanding and agreement to plan.   Final Clinical Impressions(s) / ED Diagnoses   Final diagnoses:  Chest pain, unspecified type  Pain in both lower extremities    ED Discharge Orders    None       Volanda Napoleon, PA-C 06/02/17 0017    Sherwood Gambler, MD 06/02/17 [phone]

## 2017-06-01 NOTE — ED Notes (Signed)
IV attempt x 1 without success.

## 2017-06-01 NOTE — ED Triage Notes (Signed)
Intermittent chest pain and bilateral leg pain x 2 weeks. Endorses nausea, and SOB

## 2017-06-02 NOTE — Discharge Instructions (Signed)
You can take Tylenol or Ibuprofen as directed for pain. You can alternate Tylenol and Ibuprofen every 4 hours. If you take Tylenol at 1pm, then you can take Ibuprofen at 5pm. Then you can take Tylenol again at 9pm.   Follow-up with your primary care doctor in the next 24-48 hours for further evaluation.   Return to the Emergency Department immediately if you experiencing worsening chest pain, difficulty breathing, nausea/vomiting, get very sweaty, headache or any other worsening or concerning symptoms.

## 2017-08-27 ENCOUNTER — Emergency Department (HOSPITAL_BASED_OUTPATIENT_CLINIC_OR_DEPARTMENT_OTHER)
Admission: EM | Admit: 2017-08-27 | Discharge: 2017-08-27 | Disposition: A | Discharge status: Home / Self Care | Payer: BLUE CROSS/BLUE SHIELD | Attending: Emergency Medicine | Admitting: Emergency Medicine

## 2017-08-27 ENCOUNTER — Other Ambulatory Visit: Payer: Self-pay

## 2017-08-27 ENCOUNTER — Encounter (HOSPITAL_BASED_OUTPATIENT_CLINIC_OR_DEPARTMENT_OTHER): Payer: Self-pay | Admitting: *Deleted

## 2017-08-27 DIAGNOSIS — M545 Low back pain: Secondary | ICD-10-CM | POA: Diagnosis present

## 2017-08-27 DIAGNOSIS — M5442 Lumbago with sciatica, left side: Secondary | ICD-10-CM | POA: Diagnosis not present

## 2017-08-27 DIAGNOSIS — Z87891 Personal history of nicotine dependence: Secondary | ICD-10-CM | POA: Insufficient documentation

## 2017-08-27 MED ORDER — METHOCARBAMOL 500 MG PO TABS: 1000.0000 mg | ORAL_TABLET | Freq: Four times a day (QID) | ORAL | 0 refills | Status: DC

## 2017-08-27 MED ORDER — METHOCARBAMOL 500 MG PO TABS
1000.0000 mg | ORAL_TABLET | Freq: Once | ORAL | Status: AC
  Administered 2017-08-27: 1000 mg via ORAL
  Filled 2017-08-27: qty 2

## 2017-08-27 MED ORDER — PREDNISONE 20 MG PO TABS: ORAL_TABLET | ORAL | 0 refills | Status: DC

## 2017-08-27 MED ORDER — KETOROLAC TROMETHAMINE 15 MG/ML IJ SOLN
30.0000 mg | Freq: Once | INTRAMUSCULAR | Status: AC
  Administered 2017-08-27: 30 mg via INTRAMUSCULAR
  Filled 2017-08-27: qty 2

## 2017-08-27 MED ORDER — NAPROXEN 500 MG PO TABS: 500.0000 mg | ORAL_TABLET | Freq: Two times a day (BID) | ORAL | 0 refills | Status: DC

## 2017-08-27 NOTE — ED Notes (Signed)
Pt. Reports she has pain in her back from the lower back to the upper back with a headache.  Pt able to move her arms and show the areas of pain.  No trouble with speech.    Pt. Had MRI on Friday with [facility] and is waiting on Workers [employer] to call her.

## 2017-08-27 NOTE — ED Triage Notes (Addendum)
Lower back pain with radiation into her shoulders. Injury on the job. She had an MRI 4 days ago that showed a disc problem. She was referred to a neurologist. Her MD advised her to take Ibuprofen. States she needs stronger pain meds.

## 2017-08-27 NOTE — ED Provider Notes (Signed)
[facility] EMERGENCY DEPARTMENT Provider Note   CSN: [id number] Arrival date & time: 08/27/17  1842     History   Chief Complaint Chief Complaint  Patient presents with  . Back Pain    HPI Jasmine Weber is a 47 y.o. female.  Patient with history of chronic low back pain with radiation into the left leg presents with continued symptoms.  Patient is currently in physical therapy for the same.  She had an MRI performed in the past week which showed bulging disc on the left side at L3 compressing the nerve.  It also showed generalized degenerative disc disease in her L-spine.  Patient reports pain in her lower back with radiation into her left buttock and left leg.  Pain radiates up her back and into her shoulders causing muscle spasms.  She has been taking ibuprofen without relief.  In the past she has had steroids and muscle relaxers for her back.  She has not yet followed up regarding results for her MRI.  Patient denies warning symptoms of back pain including: fecal incontinence, urinary retention or overflow incontinence, night sweats, waking from sleep with back pain, unexplained fevers or weight loss, h/o cancer, IVDU, recent trauma.        Past Medical History:  Diagnosis Date  . Anxiety   . Hypercholesteremia     Patient Active Problem List   Diagnosis Date Noted  . Traumatic hematoma of left wrist 07/04/2015  . Abnormal Pap smear of cervix 04/05/2014  . Obesity 04/05/2014    Past Surgical History:  Procedure Laterality Date  . TUBAL LIGATION       OB History    Gravida  5   Para  3   Term  3   Preterm      AB  2   Living  3     SAB      TAB  2   Ectopic      Multiple      Live Births               Home Medications    Prior to Admission medications   Medication Sig Start Date End Date Taking? Authorizing Provider  HYDROcodone-acetaminophen (NORCO/VICODIN) 5-325 MG tablet Take 1 tablet by mouth every 6 (six) hours as needed.  06/22/15   Horton, Barbette Hair, MD  ibuprofen (ADVIL,MOTRIN) 800 MG tablet Take 1 tablet (800 mg total) by mouth every 8 (eight) hours as needed. 08/19/16   Clayton Bibles, PA-C  lidocaine (LIDODERM) 5 % Place 1 patch onto the skin daily. Remove & Discard patch within 12 hours or as directed by MD 08/19/16   Clayton Bibles, PA-C  methocarbamol (ROBAXIN) 500 MG tablet Take 2 tablets (1,000 mg total) by mouth 4 (four) times daily. 08/27/17   Carlisle Cater, PA-C  naproxen (NAPROSYN) 500 MG tablet Take 1 tablet (500 mg total) by mouth 2 (two) times daily. 08/27/17   Carlisle Cater, PA-C  predniSONE (DELTASONE) 20 MG tablet 3 Tabs PO Days 1-3, then 2 tabs PO Days 4-6, then 1 tab PO Day 7-9, then Half Tab PO Day 10-12 08/27/17   Carlisle Cater, PA-C  valACYclovir (VALTREX) 1000 MG tablet TK 1 T PO D 05/21/15   [provider]  zolpidem (AMBIEN) 10 MG tablet Take 10 mg by mouth at bedtime as needed for sleep.    [provider]    Family History Family History  Problem Relation Age of Onset  . Heart failure  Mother   . Hypertension Mother   . Cancer Father        Prostate  . Diabetes Maternal Grandmother   . Cancer Maternal Grandfather     Social History Social History   Tobacco Use  . Smoking status: Former Research scientist (life sciences)  . Smokeless tobacco: Never Used  Substance Use Topics  . Alcohol use: Yes    Comment: rarely  . Drug use: No     Allergies   Patient has no known allergies.   Review of Systems Review of Systems  Constitutional: Negative for fever and unexpected weight change.  Gastrointestinal: Negative for constipation.       Negative for fecal incontinence.   Genitourinary: Negative for dysuria, flank pain, hematuria, pelvic pain, vaginal bleeding and vaginal discharge.       Negative for urinary incontinence or retention.  Musculoskeletal: Positive for back pain and myalgias.  Neurological: Negative for weakness and numbness.       Denies saddle paresthesias.     Physical  Exam Updated Vital Signs BP 133/89 (BP Location: Left Arm)   Pulse 75   Temp 98.1 F (36.7 C) (Oral)   Resp 16   Ht 5\' 7"  (1.702 m)   Wt 92.1 kg (203 lb)   LMP 08/20/2017   SpO2 100%   BMI 31.79 kg/m   Physical Exam  Constitutional: She appears well-developed and well-nourished.  HENT:  Head: Normocephalic and atraumatic.  Eyes: Conjunctivae are normal.  Neck: Normal range of motion. Neck supple.  Pulmonary/Chest: Effort normal.  Abdominal: Soft. There is no tenderness. There is no CVA tenderness.  Musculoskeletal: Normal range of motion.       Right hip: Normal.       Left hip: Normal.       Cervical back: She exhibits tenderness. She exhibits normal range of motion and no bony tenderness.       Thoracic back: She exhibits tenderness. She exhibits normal range of motion and no bony tenderness.       Lumbar back: She exhibits tenderness. She exhibits normal range of motion and no bony tenderness.  No step-off noted with palpation of spine.   Neurological: She is alert. She has normal strength and normal reflexes. No sensory deficit.  5/5 strength in entire lower extremities bilaterally. No sensation deficit.   Skin: Skin is warm and dry. No rash noted.  Psychiatric: She has a normal mood and affect.  Nursing note and vitals reviewed.    ED Treatments / Results  Labs (all labs ordered are listed, but only abnormal results are displayed) Labs Reviewed - No data to display  EKG None  Radiology No results found.  Procedures Procedures (including critical care time)  Medications Ordered in ED Medications  ketorolac (TORADOL) 15 MG/ML injection 30 mg (30 mg Intramuscular Given 08/27/17 2111)  methocarbamol (ROBAXIN) tablet 1,000 mg (1,000 mg Oral Given 08/27/17 2110)     Initial Impression / Assessment and Plan / ED Course  I have reviewed the triage vital signs and the nursing notes.  Pertinent labs & imaging results that were available during my care of the  patient were reviewed by me and considered in my medical decision making (see chart for details).     Patient seen and examined.  Reviewed recent MRI results.  Medications ordered.   Vital signs reviewed and are as follows: Vitals:   08/27/17 1856  BP: 133/89  Pulse: 75  Resp: 16  Temp: 98.1 F (36.7 C)  SpO2:  100%    No red flag s/s of low back pain. Patient was counseled on back pain precautions and told to do activity as tolerated but do not lift, push, or pull heavy objects more than 10 pounds for the next week.  Patient counseled to use ice or heat on back for no longer than 15 minutes every hour.   Patient counseled on proper use of muscle relaxant medication.  They were told not to drink alcohol, drive any vehicle, or do any dangerous activities while taking this medication.  Patient verbalized understanding.  Patient urged to follow-up with PCP if pain does not improve with treatment and rest or if pain becomes recurrent. Urged to return with worsening severe pain, loss of bowel or bladder control, trouble walking.   The patient verbalizes understanding and agrees with the plan.   Final Clinical Impressions(s) / ED Diagnoses   Final diagnoses:  Acute left-sided low back pain with left-sided sciatica   Patient with back pain. No neurological deficits. Recent MRI and exam consistent with lumbar radiculopathy. Patient is ambulatory. No warning symptoms of back pain including: fecal incontinence, urinary retention or overflow incontinence, night sweats, waking from sleep with back pain, unexplained fevers or weight loss, h/o cancer, IVDU, recent trauma. No concern for cauda equina, epidural abscess, or other serious cause of back pain. Conservative measures such as rest, ice/heat and pain medicine indicated with PCP follow-up if no improvement with conservative management.    ED Discharge Orders        Ordered    methocarbamol (ROBAXIN) 500 MG tablet  4 times daily      08/27/17 2109    predniSONE (DELTASONE) 20 MG tablet     08/27/17 2109    naproxen (NAPROSYN) 500 MG tablet  2 times daily     08/27/17 2109       Carlisle Cater, PA-C 08/27/17 2357    Charlesetta Shanks, MD 08/28/17 [phone]

## 2017-08-27 NOTE — Discharge Instructions (Signed)
Please read and follow all provided instructions.  Your diagnoses today include:  1. Acute left-sided low back pain with left-sided sciatica     Tests performed today include:  Vital signs - see below for your results today  Medications prescribed:   Robaxin (methocarbamol) - muscle relaxer medication  DO NOT drive or perform any activities that require you to be awake and alert because this medicine can make you drowsy.    Naproxen - anti-inflammatory pain medication  Do not exceed 500mg  naproxen every 12 hours, take with food  You have been prescribed an anti-inflammatory medication or NSAID. Take with food. Take smallest effective dose for the shortest duration needed for your pain. Stop taking if you experience stomach pain or vomiting.    Prednisone - steroid medicine   It is best to take this medication in the morning to prevent sleeping problems. If you are diabetic, monitor your blood sugar closely and stop taking Prednisone if blood sugar is over 300. Take with food to prevent stomach upset.   Take any prescribed medications only as directed.  Home care instructions:   Follow any educational materials contained in this packet  Please rest, use ice or heat on your back for the next several days  Do not lift, push, pull anything more than 10 pounds for the next week  Follow-up instructions: Please follow-up with your primary care provider in the next 1 week for further evaluation of your symptoms.   Return instructions:  SEEK IMMEDIATE MEDICAL ATTENTION IF YOU HAVE:  New numbness, tingling, weakness, or problem with the use of your arms or legs  Severe back pain not relieved with medications  Loss control of your bowels or bladder  Increasing pain in any areas of the body (such as chest or abdominal pain)  Shortness of breath, dizziness, or fainting.   Worsening nausea (feeling sick to your stomach), vomiting, fever, or sweats  Any other emergent  concerns regarding your health   Additional Information:  Your vital signs today were: BP 133/89 (BP Location: Left Arm)    Pulse 75    Temp 98.1 F (36.7 C) (Oral)    Resp 16    Ht 5\' 7"  (1.702 m)    Wt 92.1 kg (203 lb)    LMP 08/20/2017    SpO2 100%    BMI 31.79 kg/m  If your blood pressure (BP) was elevated above 135/85 this visit, please have this repeated by your doctor within one month. --------------

## 2017-09-29 ENCOUNTER — Encounter (HOSPITAL_BASED_OUTPATIENT_CLINIC_OR_DEPARTMENT_OTHER): Payer: Self-pay | Admitting: Emergency Medicine

## 2017-09-29 ENCOUNTER — Emergency Department (HOSPITAL_BASED_OUTPATIENT_CLINIC_OR_DEPARTMENT_OTHER)
Admission: EM | Admit: 2017-09-29 | Discharge: 2017-09-29 | Disposition: A | Discharge status: Home / Self Care | Payer: BLUE CROSS/BLUE SHIELD | Attending: Emergency Medicine | Admitting: Emergency Medicine

## 2017-09-29 ENCOUNTER — Emergency Department (HOSPITAL_BASED_OUTPATIENT_CLINIC_OR_DEPARTMENT_OTHER): Payer: BLUE CROSS/BLUE SHIELD

## 2017-09-29 ENCOUNTER — Other Ambulatory Visit: Payer: Self-pay

## 2017-09-29 DIAGNOSIS — Y939 Activity, unspecified: Secondary | ICD-10-CM | POA: Diagnosis not present

## 2017-09-29 DIAGNOSIS — S0990XA Unspecified injury of head, initial encounter: Secondary | ICD-10-CM | POA: Diagnosis present

## 2017-09-29 DIAGNOSIS — W228XXA Striking against or struck by other objects, initial encounter: Secondary | ICD-10-CM | POA: Diagnosis not present

## 2017-09-29 DIAGNOSIS — Y929 Unspecified place or not applicable: Secondary | ICD-10-CM | POA: Diagnosis not present

## 2017-09-29 DIAGNOSIS — Z79899 Other long term (current) drug therapy: Secondary | ICD-10-CM | POA: Diagnosis not present

## 2017-09-29 DIAGNOSIS — S060X0A Concussion without loss of consciousness, initial encounter: Secondary | ICD-10-CM | POA: Insufficient documentation

## 2017-09-29 DIAGNOSIS — Y998 Other external cause status: Secondary | ICD-10-CM | POA: Diagnosis not present

## 2017-09-29 DIAGNOSIS — Z87891 Personal history of nicotine dependence: Secondary | ICD-10-CM | POA: Diagnosis not present

## 2017-09-29 DIAGNOSIS — E7800 Pure hypercholesterolemia, unspecified: Secondary | ICD-10-CM | POA: Insufficient documentation

## 2017-09-29 NOTE — ED Provider Notes (Signed)
[facility] EMERGENCY DEPARTMENT Provider Note   CSN: [id number] Arrival date & time: 09/29/17  4097     History   Chief Complaint Chief Complaint  Patient presents with  . Head Injury    HPI Jasmine Weber is a 47 y.o. female.  HPI Patient hit her head on the refrigerator door 4 days ago.  States she was dazed at the time but since then has had headaches and some confusion.  States she is forgetting things that she would otherwise remember.  States she had on the top "soft" part of her head.  No neck pain.  No nausea or vomiting.  Does not tend to get headaches otherwise.  States the headaches improved somewhat after she goes to sleep.  I states she has had some difficulty remembering when she took her anxiety and muscle relaxer medicines.  States is been no change in her medications. Past Medical History:  Diagnosis Date  . Anxiety   . Hypercholesteremia     Patient Active Problem List   Diagnosis Date Noted  . Traumatic hematoma of left wrist 07/04/2015  . Abnormal Pap smear of cervix 04/05/2014  . Obesity 04/05/2014    Past Surgical History:  Procedure Laterality Date  . TUBAL LIGATION       OB History    Gravida  5   Para  3   Term  3   Preterm      AB  2   Living  3     SAB      TAB  2   Ectopic      Multiple      Live Births               Home Medications    Prior to Admission medications   Medication Sig Start Date End Date Taking? Authorizing Provider  HYDROcodone-acetaminophen (NORCO/VICODIN) 5-325 MG tablet Take 1 tablet by mouth every 6 (six) hours as needed. 06/22/15   Horton, Barbette Hair, MD  ibuprofen (ADVIL,MOTRIN) 800 MG tablet Take 1 tablet (800 mg total) by mouth every 8 (eight) hours as needed. 08/19/16   Clayton Bibles, PA-C  lidocaine (LIDODERM) 5 % Place 1 patch onto the skin daily. Remove & Discard patch within 12 hours or as directed by MD 08/19/16   Clayton Bibles, PA-C  methocarbamol (ROBAXIN) 500 MG tablet Take 2  tablets (1,000 mg total) by mouth 4 (four) times daily. 08/27/17   Carlisle Cater, PA-C  naproxen (NAPROSYN) 500 MG tablet Take 1 tablet (500 mg total) by mouth 2 (two) times daily. 08/27/17   Carlisle Cater, PA-C  predniSONE (DELTASONE) 20 MG tablet 3 Tabs PO Days 1-3, then 2 tabs PO Days 4-6, then 1 tab PO Day 7-9, then Half Tab PO Day 10-12 08/27/17   Carlisle Cater, PA-C  valACYclovir (VALTREX) 1000 MG tablet TK 1 T PO D 05/21/15   [provider]  zolpidem (AMBIEN) 10 MG tablet Take 10 mg by mouth at bedtime as needed for sleep.    [provider]    Family History Family History  Problem Relation Age of Onset  . Heart failure Mother   . Hypertension Mother   . Cancer Father        Prostate  . Diabetes Maternal Grandmother   . Cancer Maternal Grandfather     Social History Social History   Tobacco Use  . Smoking status: Former Research scientist (life sciences)  . Smokeless tobacco: Never Used  Substance Use Topics  .  Alcohol use: Yes    Comment: rarely  . Drug use: No     Allergies   Patient has no known allergies.   Review of Systems Review of Systems  Constitutional: Negative for appetite change.  HENT: Negative for congestion.   Respiratory: Negative for stridor.   Cardiovascular: Negative for chest pain.  Gastrointestinal: Negative for abdominal pain.  Genitourinary: Negative for flank pain.  Musculoskeletal: Positive for back pain.  Skin: Negative for rash and wound.  Neurological: Positive for headaches. Negative for seizures, speech difficulty, weakness and numbness.  Psychiatric/Behavioral: Positive for confusion.     Physical Exam Updated Vital Signs BP (!) 147/91 (BP Location: Left Arm)   Pulse 75   Resp 18   Ht 5\' 7"  (1.702 m)   Wt 91.6 kg (202 lb)   LMP 09/14/2017   SpO2 100%   BMI 31.64 kg/m   Physical Exam  Constitutional: She appears well-developed.  HENT:  Head: Normocephalic.  Mild tenderness over anterior top of head.  No crepitance or  deformity.  Eyes: EOM are normal.  Neck: Neck supple.  No midline cervical tenderness.  Cardiovascular: Normal rate.  Abdominal: There is no tenderness.  Musculoskeletal: She exhibits no deformity.  Neurological: She is alert.  Skin: Skin is warm. Capillary refill takes less than 2 seconds.  Psychiatric: She has a normal mood and affect.     ED Treatments / Results  Labs (all labs ordered are listed, but only abnormal results are displayed) Labs Reviewed - No data to display  EKG None  Radiology Ct Head Wo Contrast  Result Date: 09/29/2017 CLINICAL DATA:  Head trauma. EXAM: CT HEAD WITHOUT CONTRAST TECHNIQUE: Contiguous axial images were obtained from the base of the skull through the vertex without intravenous contrast. COMPARISON:  12/12/2014 head CT. FINDINGS: Brain: No evidence of parenchymal hemorrhage or extra-axial fluid collection. No mass lesion, mass effect, or midline shift. No CT evidence of acute infarction. Cerebral volume is age appropriate. No ventriculomegaly. Vascular: No acute abnormality. Skull: No evidence of calvarial fracture. Sinuses/Orbits: The visualized paranasal sinuses are essentially clear. Other:  The mastoid air cells are unopacified. IMPRESSION: Negative head CT. No evidence of acute intracranial abnormality. No evidence of calvarial fracture. Electronically Signed   By: Ilona Sorrel M.D.   On: 09/29/2017 10:30    Procedures Procedures (including critical care time)  Medications Ordered in ED Medications - No data to display   Initial Impression / Assessment and Plan / ED Course  I have reviewed the triage vital signs and the nursing notes.  Pertinent labs & imaging results that were available during my care of the patient were reviewed by me and considered in my medical decision making (see chart for details).     Patient had her head.  Has had some confusion since.  CT scan done and reassuring.  May have concussion.  Discharge home.  Final  Clinical Impressions(s) / ED Diagnoses   Final diagnoses:  Concussion without loss of consciousness, initial encounter    ED Discharge Orders    None       Davonna Belling, MD 09/29/17 1538

## 2017-09-29 NOTE — ED Triage Notes (Signed)
Patient states that she hit her head on the "soft" portion of her head on wed - since she has had a "heaviness" to her head and behind her eyes - she is having problems with memory " I cant remember when I took my anxiety medications and back medications"

## 2017-10-13 ENCOUNTER — Encounter (HOSPITAL_BASED_OUTPATIENT_CLINIC_OR_DEPARTMENT_OTHER): Payer: Self-pay | Admitting: *Deleted

## 2017-10-13 ENCOUNTER — Emergency Department (HOSPITAL_BASED_OUTPATIENT_CLINIC_OR_DEPARTMENT_OTHER)
Admission: EM | Admit: 2017-10-13 | Discharge: 2017-10-14 | Disposition: A | Discharge status: Home / Self Care | Payer: BLUE CROSS/BLUE SHIELD | Attending: Emergency Medicine | Admitting: Emergency Medicine

## 2017-10-13 ENCOUNTER — Other Ambulatory Visit: Payer: Self-pay

## 2017-10-13 DIAGNOSIS — R51 Headache: Secondary | ICD-10-CM | POA: Insufficient documentation

## 2017-10-13 DIAGNOSIS — Z79899 Other long term (current) drug therapy: Secondary | ICD-10-CM | POA: Insufficient documentation

## 2017-10-13 DIAGNOSIS — Z87891 Personal history of nicotine dependence: Secondary | ICD-10-CM | POA: Insufficient documentation

## 2017-10-13 DIAGNOSIS — R519 Headache, unspecified: Secondary | ICD-10-CM

## 2017-10-13 DIAGNOSIS — R030 Elevated blood-pressure reading, without diagnosis of hypertension: Secondary | ICD-10-CM | POA: Diagnosis present

## 2017-10-13 MED ORDER — DIPHENHYDRAMINE HCL 50 MG/ML IJ SOLN
25.0000 mg | Freq: Once | INTRAMUSCULAR | Status: AC
  Administered 2017-10-13: 25 mg via INTRAVENOUS
  Filled 2017-10-13: qty 1

## 2017-10-13 MED ORDER — METOCLOPRAMIDE HCL 5 MG/ML IJ SOLN
10.0000 mg | Freq: Once | INTRAMUSCULAR | Status: AC
  Administered 2017-10-13: 10 mg via INTRAVENOUS
  Filled 2017-10-13: qty 2

## 2017-10-13 MED ORDER — DEXAMETHASONE SODIUM PHOSPHATE 10 MG/ML IJ SOLN
10.0000 mg | Freq: Once | INTRAMUSCULAR | Status: AC
  Administered 2017-10-13: 10 mg via INTRAVENOUS
  Filled 2017-10-13: qty 1

## 2017-10-13 NOTE — ED Provider Notes (Signed)
[facility] EMERGENCY DEPARTMENT Provider Note   CSN: [id number] Arrival date & time: 10/13/17  2154     History   Chief Complaint Chief Complaint  Patient presents with  . Hypertension    HPI Jasmine Weber is a 47 y.o. female.  Patient is a 46 year old female with a history of anxiety and hyperlipidemia who presents with a headache.  She states that about 2 weeks ago she had a head injury.  She bumped her head into the corner of her refrigerator.  She was seen here in the emergency department on July 7 following the head injury and had a CT scan which was normal.  She states since that time she has had ongoing intermittent headaches.  She describes it as a bifrontal type headache which is associated with some nausea and dizziness.  She states that today her headache got a little worse which is why she came in to the ED.  She initially went to [facility] where she checked her blood pressure and it was elevated at 157/102.  She denies any vision changes.  She does have some photophobia.  She denies any numbness or weakness to her extremities.  No ataxia.  No speech deficits.  She denies any other recent head trauma.     Past Medical History:  Diagnosis Date  . Anxiety   . Hypercholesteremia     Patient Active Problem List   Diagnosis Date Noted  . Traumatic hematoma of left wrist 07/04/2015  . Abnormal Pap smear of cervix 04/05/2014  . Obesity 04/05/2014    Past Surgical History:  Procedure Laterality Date  . TUBAL LIGATION       OB History    Gravida  5   Para  3   Term  3   Preterm      AB  2   Living  3     SAB      TAB  2   Ectopic      Multiple      Live Births               Home Medications    Prior to Admission medications   Medication Sig Start Date End Date Taking? Authorizing Provider  HYDROcodone-acetaminophen (NORCO/VICODIN) 5-325 MG tablet Take 1 tablet by mouth every 6 (six) hours as needed. 06/22/15   Horton, Barbette Hair,  MD  ibuprofen (ADVIL,MOTRIN) 800 MG tablet Take 1 tablet (800 mg total) by mouth every 8 (eight) hours as needed. 08/19/16   Clayton Bibles, PA-C  lidocaine (LIDODERM) 5 % Place 1 patch onto the skin daily. Remove & Discard patch within 12 hours or as directed by MD 08/19/16   Clayton Bibles, PA-C  methocarbamol (ROBAXIN) 500 MG tablet Take 2 tablets (1,000 mg total) by mouth 4 (four) times daily. 08/27/17   Carlisle Cater, PA-C  naproxen (NAPROSYN) 500 MG tablet Take 1 tablet (500 mg total) by mouth 2 (two) times daily. 08/27/17   Carlisle Cater, PA-C  predniSONE (DELTASONE) 20 MG tablet 3 Tabs PO Days 1-3, then 2 tabs PO Days 4-6, then 1 tab PO Day 7-9, then Half Tab PO Day 10-12 08/27/17   Carlisle Cater, PA-C  valACYclovir (VALTREX) 1000 MG tablet TK 1 T PO D 05/21/15   [provider]  zolpidem (AMBIEN) 10 MG tablet Take 10 mg by mouth at bedtime as needed for sleep.    [provider]    Family History Family History  Problem Relation Age of  Onset  . Heart failure Mother   . Hypertension Mother   . Cancer Father        Prostate  . Diabetes Maternal Grandmother   . Cancer Maternal Grandfather     Social History Social History   Tobacco Use  . Smoking status: Former Research scientist (life sciences)  . Smokeless tobacco: Never Used  Substance Use Topics  . Alcohol use: Yes    Comment: rarely  . Drug use: No     Allergies   Patient has no known allergies.   Review of Systems Review of Systems  Constitutional: Negative for chills, diaphoresis, fatigue and fever.  HENT: Negative for congestion, rhinorrhea and sneezing.   Eyes: Positive for photophobia. Negative for visual disturbance.  Respiratory: Negative for cough, chest tightness and shortness of breath.   Cardiovascular: Negative for chest pain and leg swelling.  Gastrointestinal: Positive for nausea. Negative for abdominal pain, blood in stool, diarrhea and vomiting.  Genitourinary: Negative for difficulty urinating, flank pain,  frequency and hematuria.  Musculoskeletal: Negative for arthralgias and back pain.  Skin: Negative for rash.  Neurological: Positive for headaches. Negative for dizziness, speech difficulty, weakness and numbness.     Physical Exam Updated Vital Signs BP 137/79 (BP Location: Right Arm)   Pulse 79   Temp 98.2 F (36.8 C) (Oral)   Resp 18   Ht 5\' 7"  (1.702 m)   Wt 91.6 kg (202 lb)   LMP 09/09/2017   SpO2 100%   BMI 31.64 kg/m   Physical Exam  Constitutional: She is oriented to person, place, and time. She appears well-developed and well-nourished.  HENT:  Head: Normocephalic and atraumatic.  No hemotympanum on the right, the left TM is obscured by wax  Eyes: Pupils are equal, round, and reactive to light.  Fundi not well visualized  Neck: Normal range of motion. Neck supple.  Cardiovascular: Normal rate, regular rhythm and normal heart sounds.  Pulmonary/Chest: Effort normal and breath sounds normal. No respiratory distress. She has no wheezes. She has no rales. She exhibits no tenderness.  Abdominal: Soft. Bowel sounds are normal. There is no tenderness. There is no rebound and no guarding.  Musculoskeletal: Normal range of motion. She exhibits no edema.  Lymphadenopathy:    She has no cervical adenopathy.  Neurological: She is alert and oriented to person, place, and time.  Motor 5/5 all extremities Sensation grossly intact to LT all extremities Finger to Nose intact, no pronator drift CN II-XII grossly intact Gait normal   Skin: Skin is warm and dry. No rash noted.  Psychiatric: She has a normal mood and affect.     ED Treatments / Results  Labs (all labs ordered are listed, but only abnormal results are displayed) Labs Reviewed - No data to display  EKG EKG Interpretation  Date/Time:  Sunday October 13 2017 22:04:09 EDT Ventricular Rate:  72 PR Interval:    QRS Duration: 85 QT Interval:  571 QTC Calculation: 625 R Axis:   19 Text Interpretation:  Sinus  rhythm Low voltage, precordial leads RSR' in V1 or V2, probably normal variant Borderline T abnormalities, diffuse leads Prolonged QT interval Baseline wander in lead(s) II III aVR aVF V5 since last tracing no significant change Confirmed by Malvin Johns [phone]) on 10/13/2017 10:08:52 PM   Radiology No results found.  Procedures Procedures (including critical care time)  Medications Ordered in ED Medications  metoCLOPramide (REGLAN) injection 10 mg (has no administration in time range)  diphenhydrAMINE (BENADRYL) injection 25 mg (has  no administration in time range)  dexamethasone (DECADRON) injection 10 mg (has no administration in time range)     Initial Impression / Assessment and Plan / ED Course  I have reviewed the triage vital signs and the nursing notes.  Pertinent labs & imaging results that were available during my care of the patient were reviewed by me and considered in my medical decision making (see chart for details).     Patient is a 47 year old female who presents with a headache and elevated blood pressure.  Her blood pressure has improved since arrival to the ED.  She does have a bifrontal type headache.  No neurologic deficits.  No ataxia.  She has been having a similar type headaches since she had a minor head injury in early July.  She has a migraine cocktail ordered.  Dr. Dina Rich to take over care pending re-evaluation after tx.  She has been advised to f/u with her PCP regarding the ongoing headaches/concussion symptoms.  She does not have any symptoms that I feel warrant repeat imaging.  Final Clinical Impressions(s) / ED Diagnoses   Final diagnoses:  Acute nonintractable headache, unspecified headache type    ED Discharge Orders    None       Malvin Johns, MD 10/13/17 2310

## 2017-10-13 NOTE — ED Notes (Signed)
ED Provider at bedside. 

## 2017-10-13 NOTE — ED Triage Notes (Addendum)
Pt reports BP elevated this evening. C/o headache, dizzy, light sensitive at this time. Reports she had a concussion 2 weeks ago and has been having chest pain this week

## 2017-10-14 NOTE — ED Notes (Signed)
ED Provider at bedside. 

## 2017-10-14 NOTE — ED Provider Notes (Signed)
12:56 AM  On recheck, patient states she feels much better after a migraine cocktail.  Vital signs reviewed and blood pressure reassuring at 129/81.  Will discharge patient home with outpatient follow-up.  Patient was given concussion precautions per Dr. Tamera Punt.  After history, exam, and medical workup I feel the patient has been appropriately medically screened and is safe for discharge home. Pertinent diagnoses were discussed with the patient. Patient was given return precautions.    Merryl Hacker, MD 10/14/17 [phone]

## 2017-10-14 NOTE — ED Notes (Signed)
Pt and FM given d/c instructions. Verbalizes understanding. No questions.

## 2017-11-02 ENCOUNTER — Emergency Department (HOSPITAL_BASED_OUTPATIENT_CLINIC_OR_DEPARTMENT_OTHER): Payer: BLUE CROSS/BLUE SHIELD

## 2017-11-02 ENCOUNTER — Emergency Department (HOSPITAL_BASED_OUTPATIENT_CLINIC_OR_DEPARTMENT_OTHER)
Admission: EM | Admit: 2017-11-02 | Discharge: 2017-11-02 | Disposition: A | Discharge status: Home / Self Care | Payer: BLUE CROSS/BLUE SHIELD | Attending: Emergency Medicine | Admitting: Emergency Medicine

## 2017-11-02 ENCOUNTER — Encounter (HOSPITAL_BASED_OUTPATIENT_CLINIC_OR_DEPARTMENT_OTHER): Payer: Self-pay | Admitting: Emergency Medicine

## 2017-11-02 ENCOUNTER — Other Ambulatory Visit: Payer: Self-pay

## 2017-11-02 DIAGNOSIS — R0602 Shortness of breath: Secondary | ICD-10-CM | POA: Diagnosis present

## 2017-11-02 DIAGNOSIS — Z87891 Personal history of nicotine dependence: Secondary | ICD-10-CM | POA: Diagnosis not present

## 2017-11-02 DIAGNOSIS — F419 Anxiety disorder, unspecified: Secondary | ICD-10-CM | POA: Insufficient documentation

## 2017-11-02 DIAGNOSIS — Z79899 Other long term (current) drug therapy: Secondary | ICD-10-CM | POA: Diagnosis not present

## 2017-11-02 DIAGNOSIS — J029 Acute pharyngitis, unspecified: Secondary | ICD-10-CM | POA: Insufficient documentation

## 2017-11-02 LAB — CBC
HEMATOCRIT: 36.3 % (ref 36.0–46.0)
Hemoglobin: 12.4 g/dL (ref 12.0–15.0)
MCH: 27.6 pg (ref 26.0–34.0)
MCHC: 34.2 g/dL (ref 30.0–36.0)
MCV: 80.7 fL (ref 78.0–100.0)
PLATELETS: 266 10*3/uL (ref 150–400)
RBC: 4.5 MIL/uL (ref 3.87–5.11)
RDW: 15.5 % (ref 11.5–15.5)
WBC: 8.3 10*3/uL (ref 4.0–10.5)

## 2017-11-02 LAB — BASIC METABOLIC PANEL
Anion gap: 7 (ref 5–15)
BUN: 10 mg/dL (ref 6–20)
CHLORIDE: 107 mmol/L (ref 98–111)
CO2: 23 mmol/L (ref 22–32)
CREATININE: 0.64 mg/dL (ref 0.44–1.00)
Calcium: 8.3 mg/dL — ABNORMAL LOW (ref 8.9–10.3)
GFR calc Af Amer: >60 mL/min (ref >60)
GFR calc non Af Amer: >60 mL/min (ref >60)
Glucose, Bld: 101 mg/dL — ABNORMAL HIGH (ref 70–99)
Potassium: 3.7 mmol/L (ref 3.5–5.1)
Sodium: 137 mmol/L (ref 135–145)

## 2017-11-02 LAB — TROPONIN I: Troponin I: <0.03 ng/mL (ref <0.03)

## 2017-11-02 LAB — BRAIN NATRIURETIC PEPTIDE: B Natriuretic Peptide: 18.8 pg/mL (ref 0.0–100.0)

## 2017-11-02 LAB — D-DIMER, QUANTITATIVE: D-Dimer, Quant: 0.89 ug/mL-FEU — ABNORMAL HIGH (ref 0.00–0.50)

## 2017-11-02 MED ORDER — IOPAMIDOL (ISOVUE-370) INJECTION 76%
100.0000 mL | Freq: Once | INTRAVENOUS | Status: AC | PRN
  Administered 2017-11-02: 100 mL via INTRAVENOUS

## 2017-11-02 NOTE — Discharge Instructions (Signed)
Your work-up today is very reassuring, CT scan shows no evidence of blood clot or other problem in the lungs and labs do not suggest this is acute problem with your heart or lungs.  Sore throat may be due to a virus I recommend that you treat this symptomatically with Zyrtec, warm fluids and monitor it closely.  If you begin having fevers or chills, worsening leg swelling, especially if it is present on one side, pain in your leg, worsening shortness of breath chest pain or any other new or concerning symptoms please do not hesitate to return to the ED for reevaluation otherwise please use the phone number on your paperwork today to establish care with a primary care doctor for follow-up.

## 2017-11-02 NOTE — ED Notes (Addendum)
AMBULATING  Pt maintained 97-100% SpO2 and 86-90BPM while ambulating around department.

## 2017-11-02 NOTE — ED Provider Notes (Signed)
[place] [facility] EMERGENCY DEPARTMENT Provider Note   CSN: [id number] Arrival date & time: 11/02/17  1322     History   Chief Complaint Chief Complaint  Patient presents with  . Shortness of Breath    HPI Jasmine Weber is a 47 y.o. female.  Jasmine Weber is a 47 y.o. Female with a history of anxiety and hyperlipidemia, presents to the emergency department for 3 to 4 days of shortness of breath with associated bilateral ankle swelling.  She reports that she first noticed that her ankles were swelling about 4 days ago, this seems to be worse at the end of the day after she spends the vast majority of her today on her feet at work, this does seem to improve as she elevates them and gets off her feet, but she has also noticed that she is been increasingly short of breath at rest and with activity.  She reports the swelling seems to be a bit more pronounced in her left leg, both of them seem improved today than from what they were looking like during the week.  She denies associated chest pain, reports it feels a bit tight.  No wheezing, occasional nonproductive cough.  No fevers or chills.  Patient reports her throat has felt a little bit scratchy over the last 3 days as well.  He denies any associated abdominal pain, nausea or vomiting, no diarrhea.  She has a family history of blood clots but is never had one personally, is not on any estrogen therapy, no recent surgeries or long distance travel, no hemoptysis.  Patient uses a vape occasionally but does not smoke cigarettes, denies other drug use.  No history of cardiac events.     Past Medical History:  Diagnosis Date  . Anxiety   . Hypercholesteremia     Patient Active Problem List   Diagnosis Date Noted  . Traumatic hematoma of left wrist 07/04/2015  . Abnormal Pap smear of cervix 04/05/2014  . Obesity 04/05/2014    Past Surgical History:  Procedure Laterality Date  . TUBAL LIGATION       OB History    Gravida  5   Para  3   Term  3   Preterm      AB  2   Living  3     SAB      TAB  2   Ectopic      Multiple      Live Births               Home Medications    Prior to Admission medications   Medication Sig Start Date End Date Taking? Authorizing Provider  DULoxetine (CYMBALTA) 20 MG capsule Take 20 mg by mouth 2 (two) times daily.    [provider]  HYDROcodone-acetaminophen (NORCO/VICODIN) 5-325 MG tablet Take 1 tablet by mouth every 6 (six) hours as needed. 06/22/15   Horton, Barbette Hair, MD  ibuprofen (ADVIL,MOTRIN) 800 MG tablet Take 1 tablet (800 mg total) by mouth every 8 (eight) hours as needed. 08/19/16   Clayton Bibles, PA-C  lidocaine (LIDODERM) 5 % Place 1 patch onto the skin daily. Remove & Discard patch within 12 hours or as directed by MD 08/19/16   Clayton Bibles, PA-C  methocarbamol (ROBAXIN) 500 MG tablet Take 2 tablets (1,000 mg total) by mouth 4 (four) times daily. 08/27/17   Carlisle Cater, PA-C  naproxen (NAPROSYN) 500 MG tablet Take 1 tablet (500 mg total) by mouth 2 (two)  times daily. 08/27/17   Carlisle Cater, PA-C  predniSONE (DELTASONE) 20 MG tablet 3 Tabs PO Days 1-3, then 2 tabs PO Days 4-6, then 1 tab PO Day 7-9, then Half Tab PO Day 10-12 08/27/17   Carlisle Cater, PA-C  valACYclovir (VALTREX) 1000 MG tablet TK 1 T PO D 05/21/15   [provider]  zolpidem (AMBIEN) 10 MG tablet Take 10 mg by mouth at bedtime as needed for sleep.    [provider]    Family History Family History  Problem Relation Age of Onset  . Heart failure Mother   . Hypertension Mother   . Cancer Father        Prostate  . Diabetes Maternal Grandmother   . Cancer Maternal Grandfather     Social History Social History   Tobacco Use  . Smoking status: Former Research scientist (life sciences)  . Smokeless tobacco: Never Used  Substance Use Topics  . Alcohol use: Yes    Comment: rarely  . Drug use: No     Allergies   Patient has no known allergies.   Review of Systems Review of  Systems  Constitutional: Negative for chills and fever.  HENT: Positive for sore throat. Negative for congestion, rhinorrhea and sinus pain.   Eyes: Negative for visual disturbance.  Respiratory: Positive for cough and shortness of breath. Negative for chest tightness and wheezing.   Cardiovascular: Positive for leg swelling. Negative for chest pain and palpitations.  Gastrointestinal: Negative for abdominal pain, nausea and vomiting.  Genitourinary: Negative for dysuria and frequency.  Musculoskeletal: Negative for arthralgias and gait problem.  Skin: Negative for color change and rash.  Neurological: Negative for dizziness, syncope and light-headedness.     Physical Exam Updated Vital Signs BP 126/66 (BP Location: Left Arm)   Pulse 73   Temp 98.8 F (37.1 C) (Oral)   Resp 18   Ht 5\' 7"  (1.702 m)   Wt 89.8 kg   LMP 10/09/2017   SpO2 97%   BMI 31.01 kg/m   Physical Exam  Constitutional: She is oriented to person, place, and time. She appears well-developed and well-nourished.  Non-toxic appearance. She does not appear ill. No distress.  HENT:  Head: Normocephalic and atraumatic.  Mouth/Throat: Oropharynx is clear and moist.  Posterior oropharynx is clear and moist, minimal erythema, no tonsillar edema or exudates noted, tolerating secretions without difficulty, uvula midline, no trismus and normal phonation. Bilateral nares patent without edema  Eyes: Pupils are equal, round, and reactive to light. EOM are normal. Right eye exhibits no discharge. Left eye exhibits no discharge.  Neck: Normal range of motion. Neck supple.  No stridor on exam  Cardiovascular: Normal rate, regular rhythm, normal heart sounds and intact distal pulses.  Pulmonary/Chest: Effort normal and breath sounds normal. No respiratory distress.  Respirations equal and unlabored, patient able to speak in full sentences, lungs clear to auscultation bilaterally, chest NTTP  Abdominal: Soft. Bowel sounds are  normal. She exhibits no distension and no mass. There is no tenderness. There is no guarding.  Abdomen soft, nondistended, nontender to palpation in all quadrants without guarding or peritoneal signs  Musculoskeletal: She exhibits no edema or deformity.  No appreciable edema over bilateral lower extremities today, no erythema or tenderness to palpation, no palpable cord  Neurological: She is alert and oriented to person, place, and time. Coordination normal.  Skin: Skin is warm and dry. Capillary refill takes less than 2 seconds. She is not diaphoretic.  Psychiatric: She has a normal  mood and affect. Her behavior is normal.  Nursing note and vitals reviewed.    ED Treatments / Results  Labs (all labs ordered are listed, but only abnormal results are displayed) Labs Reviewed  BASIC METABOLIC PANEL - Abnormal; Notable for the following components:      Result Value   Glucose, Bld 101 (*)    Calcium 8.3 (*)    All other components within normal limits  D-DIMER, QUANTITATIVE (NOT AT [facility]) - Abnormal; Notable for the following components:   D-Dimer, Quant 0.89 (*)    All other components within normal limits  CBC  TROPONIN I  BRAIN NATRIURETIC PEPTIDE  PREGNANCY, URINE    EKG EKG Interpretation  Date/Time:  Saturday November 02 2017 13:34:45 EDT Ventricular Rate:  72 PR Interval:    QRS Duration: 78 QT Interval:  626 QTC Calculation: 686 R Axis:   29 Text Interpretation:  Sinus rhythm Low voltage, precordial leads Borderline T abnormalities, lateral leads Prolonged QT interval No significant change since last tracing Confirmed by Fredia Sorrow [phone]) on 11/02/2017 2:27:09 PM   Radiology Dg Chest 2 View  Result Date: 11/02/2017 CLINICAL DATA:  Shortness of breath EXAM: CHEST - 2 VIEW COMPARISON:  None. FINDINGS: The heart size and mediastinal contours are within normal limits. Both lungs are clear. The visualized skeletal structures are unremarkable. IMPRESSION: No active  cardiopulmonary disease. Electronically Signed   By: Kathreen Devoid   On: 11/02/2017 14:05   Ct Angio Chest Pe W And/or Wo Contrast  Result Date: 11/02/2017 CLINICAL DATA:  Shortness of breath with ankle swelling for 3 days EXAM: CT ANGIOGRAPHY CHEST WITH CONTRAST TECHNIQUE: Multidetector CT imaging of the chest was performed using the standard protocol during bolus administration of intravenous contrast. Multiplanar CT image reconstructions and MIPs were obtained to evaluate the vascular anatomy. CONTRAST:  118mL ISOVUE-370 IOPAMIDOL (ISOVUE-370) INJECTION 76% COMPARISON:  None. FINDINGS: Cardiovascular: Satisfactory opacification of the pulmonary arteries to the segmental level. No evidence of pulmonary embolism. Normal heart size. No pericardial effusion. Mediastinum/Nodes: No enlarged mediastinal, hilar, or axillary lymph nodes. Thyroid gland, trachea, and esophagus demonstrate no significant findings. Lungs/Pleura: Lungs are clear. No pleural effusion or pneumothorax. Upper Abdomen: No acute abnormality. Musculoskeletal: No chest wall abnormality. No acute or significant osseous findings. Review of the MIP images confirms the above findings. IMPRESSION: 1. No evidence of pulmonary embolus. 2. No acute cardiopulmonary disease. Electronically Signed   By: Kathreen Devoid   On: 11/02/2017 15:07    Procedures Procedures (including critical care time)  Medications Ordered in ED Medications  iopamidol (ISOVUE-370) 76 % injection 100 mL (100 mLs Intravenous Contrast Given 11/02/17 1445)     Initial Impression / Assessment and Plan / ED Course  I have reviewed the triage vital signs and the nursing notes.  Pertinent labs & imaging results that were available during my care of the patient were reviewed by me and considered in my medical decision making (see chart for details).  Patient presents for evaluation of shortness of breath with associated bilateral ankle swelling, worse on the left than the  right.  No associated chest pain.  No prior history of DVT or PE, although patient reports family history of blood clots in multiple of her close family members.  Will check d-dimer.  Story is not typical of ACS she is not having any chest pain or pain in the arm neck or jaw, symptoms are not specifically worsened with exertion, EKG without concerning ischemic changes will check  troponin as well.  Patient does not have history of CHF, no history of cardiac events, and does not have a history of hypertension to put her at high risk for this, Chest x-ray ordered from triage which shows no evidence of pulmonary vascular congestion or edema.  She has had some increased swelling and has not had a history of this before we will check BNP.  She has had some intermittent cough, but no fevers, mild sore throat, which is not concerning on exam for strep throat or PTA or RPA, no stridor on exam, I feel this is strep, and may be viral, and chest x-ray shows no evidence of pneumonia.  Abdominal exam is benign.  Vitals have remained stable since arrival here in the ED.  Chest x-ray clear, EKG without concerning changes, negative troponin, negative BNP, no leukocytosis, normal hemoglobin, no acute electrolyte derangements, d-dimer is elevated at 0.89, will proceed with CTA of the chest.  CTA shows no evidence of acute PE or other cardiopulmonary disease.  Given the patient does not have any swelling, or tenderness over bilateral lower extremities at this time, I do not feel that DVT study is necessary today. Pt ambulated in the department while maintaining normal O2 saturations and pulse, without increased work of breathing.  Will have patient follow-up with primary care.  Strict return precautions discussed with the patient.  Final Clinical Impressions(s) / ED Diagnoses   Final diagnoses:  SOB (shortness of breath)  Sore throat    ED Discharge Orders    None       Jacqlyn Larsen, [place] 11/02/17 1607      Fredia Sorrow, MD 11/05/17 [phone]

## 2017-11-02 NOTE — ED Triage Notes (Signed)
SOB with ankle swelling x 3 days.

## 2018-03-11 ENCOUNTER — Emergency Department (HOSPITAL_BASED_OUTPATIENT_CLINIC_OR_DEPARTMENT_OTHER): Payer: 59

## 2018-03-11 ENCOUNTER — Emergency Department (HOSPITAL_BASED_OUTPATIENT_CLINIC_OR_DEPARTMENT_OTHER)
Admission: EM | Admit: 2018-03-11 | Discharge: 2018-03-11 | Disposition: A | Discharge status: Home / Self Care | Payer: 59 | Attending: Emergency Medicine | Admitting: Emergency Medicine

## 2018-03-11 ENCOUNTER — Encounter (HOSPITAL_BASED_OUTPATIENT_CLINIC_OR_DEPARTMENT_OTHER): Payer: Self-pay | Admitting: Emergency Medicine

## 2018-03-11 ENCOUNTER — Other Ambulatory Visit: Payer: Self-pay

## 2018-03-11 DIAGNOSIS — Y929 Unspecified place or not applicable: Secondary | ICD-10-CM | POA: Diagnosis not present

## 2018-03-11 DIAGNOSIS — Z87891 Personal history of nicotine dependence: Secondary | ICD-10-CM | POA: Insufficient documentation

## 2018-03-11 DIAGNOSIS — X58XXXA Exposure to other specified factors, initial encounter: Secondary | ICD-10-CM | POA: Diagnosis not present

## 2018-03-11 DIAGNOSIS — S5012XA Contusion of left forearm, initial encounter: Secondary | ICD-10-CM | POA: Insufficient documentation

## 2018-03-11 DIAGNOSIS — Y9383 Activity, rough housing and horseplay: Secondary | ICD-10-CM | POA: Insufficient documentation

## 2018-03-11 DIAGNOSIS — Y999 Unspecified external cause status: Secondary | ICD-10-CM | POA: Diagnosis not present

## 2018-03-11 DIAGNOSIS — Z79899 Other long term (current) drug therapy: Secondary | ICD-10-CM | POA: Diagnosis not present

## 2018-03-11 DIAGNOSIS — S40022A Contusion of left upper arm, initial encounter: Secondary | ICD-10-CM

## 2018-03-11 DIAGNOSIS — S59912A Unspecified injury of left forearm, initial encounter: Secondary | ICD-10-CM | POA: Diagnosis present

## 2018-03-11 MED ORDER — KETOROLAC TROMETHAMINE 30 MG/ML IJ SOLN
30.0000 mg | Freq: Once | INTRAMUSCULAR | Status: AC
  Administered 2018-03-11: 30 mg via INTRAMUSCULAR
  Filled 2018-03-11: qty 1

## 2018-03-11 MED ORDER — NAPROXEN 500 MG PO TABS: 500.0000 mg | ORAL_TABLET | Freq: Two times a day (BID) | ORAL | 0 refills | Status: DC

## 2018-03-11 NOTE — ED Triage Notes (Signed)
Pt c/o pain and swelling to left forearm after hitting it on a wall.

## 2018-03-11 NOTE — Discharge Instructions (Addendum)
You were seen today for left arm pain.  Your x-rays are negative for fracture.  Apply ice and take medications as prescribed.  This will likely heal well on its own.

## 2018-03-11 NOTE — ED Provider Notes (Signed)
[place] EMERGENCY DEPARTMENT Provider Note   CSN: [id number] Arrival date & time: 03/11/18  0346     History   Chief Complaint Chief Complaint  Patient presents with  . Arm Injury    HPI Jasmine Weber is a 47 y.o. female.  HPI  This is a 47 year old female who presents with left forearm pain.  Patient reports that she was roughhousing on Friday and hit her left forearm.  Since that time she has noted swelling and increasing pain to the forearm.  She has used ice with minimal relief.  She denies any numbness or tingling of the hand.  She is right-handed.  She denies any other injury.  Past Medical History:  Diagnosis Date  . Anxiety   . Hypercholesteremia     Patient Active Problem List   Diagnosis Date Noted  . Traumatic hematoma of left wrist 07/04/2015  . Abnormal Pap smear of cervix 04/05/2014  . Obesity 04/05/2014    Past Surgical History:  Procedure Laterality Date  . TUBAL LIGATION       OB History    Gravida  5   Para  3   Term  3   Preterm      AB  2   Living  3     SAB      TAB  2   Ectopic      Multiple      Live Births               Home Medications    Prior to Admission medications   Medication Sig Start Date End Date Taking? Authorizing Provider  DULoxetine (CYMBALTA) 20 MG capsule Take 20 mg by mouth 2 (two) times daily.    [provider]  HYDROcodone-acetaminophen (NORCO/VICODIN) 5-325 MG tablet Take 1 tablet by mouth every 6 (six) hours as needed. 06/22/15   Horton, Barbette Hair, MD  ibuprofen (ADVIL,MOTRIN) 800 MG tablet Take 1 tablet (800 mg total) by mouth every 8 (eight) hours as needed. 08/19/16   Clayton Bibles, PA-C  lidocaine (LIDODERM) 5 % Place 1 patch onto the skin daily. Remove & Discard patch within 12 hours or as directed by MD 08/19/16   Clayton Bibles, PA-C  methocarbamol (ROBAXIN) 500 MG tablet Take 2 tablets (1,000 mg total) by mouth 4 (four) times daily. 08/27/17   Carlisle Cater, PA-C    naproxen (NAPROSYN) 500 MG tablet Take 1 tablet (500 mg total) by mouth 2 (two) times daily. 08/27/17   Carlisle Cater, PA-C  naproxen (NAPROSYN) 500 MG tablet Take 1 tablet (500 mg total) by mouth 2 (two) times daily. 03/11/18   Horton, Barbette Hair, MD  predniSONE (DELTASONE) 20 MG tablet 3 Tabs PO Days 1-3, then 2 tabs PO Days 4-6, then 1 tab PO Day 7-9, then Half Tab PO Day 10-12 08/27/17   Carlisle Cater, PA-C  valACYclovir (VALTREX) 1000 MG tablet TK 1 T PO D 05/21/15   [provider]  zolpidem (AMBIEN) 10 MG tablet Take 10 mg by mouth at bedtime as needed for sleep.    [provider]    Family History Family History  Problem Relation Age of Onset  . Heart failure Mother   . Hypertension Mother   . Cancer Father        Prostate  . Diabetes Maternal Grandmother   . Cancer Maternal Grandfather     Social History Social History   Tobacco Use  . Smoking status: Former Research scientist (life sciences)  .  Smokeless tobacco: Never Used  Substance Use Topics  . Alcohol use: Yes    Comment: rarely  . Drug use: No     Allergies   Patient has no known allergies.   Review of Systems Review of Systems  Constitutional: Negative for fever.  Musculoskeletal:       Left forearm pain  Skin: Negative for color change.  Neurological: Negative for weakness and numbness.  All other systems reviewed and are negative.    Physical Exam Updated Vital Signs BP (!) 144/87 (BP Location: Right Arm)   Pulse 67   Temp (!) 97.4 F (36.3 C) (Oral)   Resp 16   Ht 1.702 m (5\' 7" )   Wt 93.9 kg   LMP 03/01/2018   SpO2 100%   BMI 32.42 kg/m   Physical Exam Vitals signs and nursing note reviewed.  Constitutional:      Appearance: She is well-developed. She is not ill-appearing.  HENT:     Head: Normocephalic and atraumatic.  Cardiovascular:     Rate and Rhythm: Normal rate and regular rhythm.  Pulmonary:     Effort: Pulmonary effort is normal. No respiratory distress.  Musculoskeletal:      Comments: Tenderness palpation mid forearm, no significant swelling or deformity noted, 2+ radial pulse, neurovascular intact distally  Skin:    General: Skin is warm and dry.  Neurological:     Mental Status: She is alert and oriented to person, place, and time.      ED Treatments / Results  Labs (all labs ordered are listed, but only abnormal results are displayed) Labs Reviewed - No data to display  EKG None  Radiology Dg Forearm Left  Result Date: 03/11/2018 CLINICAL DATA:  Left forearm pain and swelling after injury. EXAM: LEFT FOREARM - 2 VIEW COMPARISON:  None. FINDINGS: Cortical margins of the radius and ulna are intact. There is no evidence of fracture or other focal bone lesions. Soft tissues are unremarkable. No radiopaque foreign body. IMPRESSION: Negative radiographs of the left forearm. Electronically Signed   By: Keith Rake M.D.   On: 03/11/2018 06:07    Procedures Procedures (including critical care time)  Medications Ordered in ED Medications  ketorolac (TORADOL) 30 MG/ML injection 30 mg (30 mg Intramuscular Given 03/11/18 0408)     Initial Impression / Assessment and Plan / ED Course  I have reviewed the triage vital signs and the nursing notes.  Pertinent labs & imaging results that were available during my care of the patient were reviewed by me and considered in my medical decision making (see chart for details).     Patient presents with left forearm pain.  Reports injury on Friday.  She is overall nontoxic and neurovascularly intact.  No obvious deformities.  X-rays negative for fracture.  Suspect contusion.  Recommend ice and naproxen.  After history, exam, and medical workup I feel the patient has been appropriately medically screened and is safe for discharge home. Pertinent diagnoses were discussed with the patient. Patient was given return precautions.   Final Clinical Impressions(s) / ED Diagnoses   Final diagnoses:  Arm contusion,  left, initial encounter    ED Discharge Orders         Ordered    naproxen (NAPROSYN) 500 MG tablet  2 times daily     03/11/18 0613           Merryl Hacker, MD 03/11/18 [phone]

## 2018-07-01 ENCOUNTER — Emergency Department (HOSPITAL_BASED_OUTPATIENT_CLINIC_OR_DEPARTMENT_OTHER)
Admission: EM | Admit: 2018-07-01 | Discharge: 2018-07-01 | Disposition: A | Discharge status: Home / Self Care | Payer: Self-pay | Attending: Emergency Medicine | Admitting: Emergency Medicine

## 2018-07-01 ENCOUNTER — Other Ambulatory Visit: Payer: Self-pay

## 2018-07-01 ENCOUNTER — Encounter (HOSPITAL_BASED_OUTPATIENT_CLINIC_OR_DEPARTMENT_OTHER): Payer: Self-pay | Admitting: *Deleted

## 2018-07-01 ENCOUNTER — Emergency Department (HOSPITAL_BASED_OUTPATIENT_CLINIC_OR_DEPARTMENT_OTHER): Payer: Self-pay

## 2018-07-01 DIAGNOSIS — R112 Nausea with vomiting, unspecified: Secondary | ICD-10-CM | POA: Insufficient documentation

## 2018-07-01 DIAGNOSIS — R1030 Lower abdominal pain, unspecified: Secondary | ICD-10-CM | POA: Insufficient documentation

## 2018-07-01 DIAGNOSIS — R197 Diarrhea, unspecified: Secondary | ICD-10-CM | POA: Insufficient documentation

## 2018-07-01 DIAGNOSIS — N926 Irregular menstruation, unspecified: Secondary | ICD-10-CM | POA: Insufficient documentation

## 2018-07-01 DIAGNOSIS — Z79899 Other long term (current) drug therapy: Secondary | ICD-10-CM | POA: Insufficient documentation

## 2018-07-01 DIAGNOSIS — Z87891 Personal history of nicotine dependence: Secondary | ICD-10-CM | POA: Insufficient documentation

## 2018-07-01 LAB — COMPREHENSIVE METABOLIC PANEL
ALT: 16 U/L (ref 0–44)
AST: 16 U/L (ref 15–41)
Albumin: 3.7 g/dL (ref 3.5–5.0)
Alkaline Phosphatase: 60 U/L (ref 38–126)
Anion gap: 8 (ref 5–15)
BUN: 11 mg/dL (ref 6–20)
CO2: 25 mmol/L (ref 22–32)
Calcium: 8.6 mg/dL — ABNORMAL LOW (ref 8.9–10.3)
Chloride: 102 mmol/L (ref 98–111)
Creatinine, Ser: 0.59 mg/dL (ref 0.44–1.00)
GFR calc Af Amer: >60 mL/min (ref >60)
GFR calc non Af Amer: >60 mL/min (ref >60)
Glucose, Bld: 82 mg/dL (ref 70–99)
Potassium: 3.4 mmol/L — ABNORMAL LOW (ref 3.5–5.1)
Sodium: 135 mmol/L (ref 135–145)
Total Bilirubin: 0.8 mg/dL (ref 0.3–1.2)
Total Protein: 6.9 g/dL (ref 6.5–8.1)

## 2018-07-01 LAB — CBC WITH DIFFERENTIAL/PLATELET
Abs Immature Granulocytes: 0.03 10*3/uL (ref 0.00–0.07)
Basophils Absolute: 0 10*3/uL (ref 0.0–0.1)
Basophils Relative: 0 %
Eosinophils Absolute: 0 10*3/uL (ref 0.0–0.5)
Eosinophils Relative: 0 %
HCT: 41.9 % (ref 36.0–46.0)
Hemoglobin: 13.5 g/dL (ref 12.0–15.0)
Immature Granulocytes: 0 %
Lymphocytes Relative: 15 %
Lymphs Abs: 1.8 10*3/uL (ref 0.7–4.0)
MCH: 26.9 pg (ref 26.0–34.0)
MCHC: 32.2 g/dL (ref 30.0–36.0)
MCV: 83.5 fL (ref 80.0–100.0)
Monocytes Absolute: 0.9 10*3/uL (ref 0.1–1.0)
Monocytes Relative: 7 %
Neutro Abs: 9.2 10*3/uL — ABNORMAL HIGH (ref 1.7–7.7)
Neutrophils Relative %: 78 %
Platelets: 290 10*3/uL (ref 150–400)
RBC: 5.02 MIL/uL (ref 3.87–5.11)
RDW: 15.9 % — ABNORMAL HIGH (ref 11.5–15.5)
WBC: 11.9 10*3/uL — ABNORMAL HIGH (ref 4.0–10.5)
nRBC: 0 % (ref 0.0–0.2)

## 2018-07-01 LAB — URINALYSIS, MICROSCOPIC (REFLEX): RBC / HPF: >50 RBC/hpf (ref 0–5)

## 2018-07-01 LAB — URINALYSIS, ROUTINE W REFLEX MICROSCOPIC
Bilirubin Urine: NEGATIVE
Glucose, UA: NEGATIVE mg/dL
Ketones, ur: 15 mg/dL — AB
Nitrite: NEGATIVE
Protein, ur: 100 mg/dL — AB
Specific Gravity, Urine: 1.01 (ref 1.005–1.030)
pH: 5.5 (ref 5.0–8.0)

## 2018-07-01 LAB — LIPASE, BLOOD: Lipase: 25 U/L (ref 11–51)

## 2018-07-01 LAB — PREGNANCY, URINE: Preg Test, Ur: NEGATIVE

## 2018-07-01 MED ORDER — ONDANSETRON HCL 4 MG/2ML IJ SOLN
4.0000 mg | Freq: Once | INTRAMUSCULAR | Status: AC
  Administered 2018-07-01: 4 mg via INTRAVENOUS
  Filled 2018-07-01: qty 2

## 2018-07-01 MED ORDER — ONDANSETRON 4 MG PO TBDP: 4.0000 mg | ORAL_TABLET | Freq: Three times a day (TID) | ORAL | 0 refills | Status: DC | PRN

## 2018-07-01 MED ORDER — DICYCLOMINE HCL 10 MG PO CAPS
10.0000 mg | ORAL_CAPSULE | Freq: Once | ORAL | Status: AC
  Administered 2018-07-01: 10 mg via ORAL
  Filled 2018-07-01: qty 1

## 2018-07-01 MED ORDER — IOHEXOL 300 MG/ML  SOLN
100.0000 mL | Freq: Once | INTRAMUSCULAR | Status: AC | PRN
  Administered 2018-07-01: 100 mL via INTRAVENOUS

## 2018-07-01 MED ORDER — SODIUM CHLORIDE 0.9 % IV BOLUS
1000.0000 mL | Freq: Once | INTRAVENOUS | Status: AC
  Administered 2018-07-01: 1000 mL via INTRAVENOUS

## 2018-07-01 MED ORDER — DICYCLOMINE HCL 20 MG PO TABS: 20.0000 mg | ORAL_TABLET | Freq: Two times a day (BID) | ORAL | 0 refills | Status: DC

## 2018-07-01 NOTE — ED Notes (Signed)
Tolerated po gingerale. No emesis or abd pain.

## 2018-07-01 NOTE — ED Triage Notes (Signed)
Abdominal pain for a week with diarrhea. Nausea.

## 2018-07-01 NOTE — ED Notes (Signed)
Patient returned from [place]. Denies needs. Call light in reach.

## 2018-07-01 NOTE — ED Notes (Signed)
Urine to lab

## 2018-07-01 NOTE — Discharge Instructions (Signed)
Evaluated today for abdominal pain and diarrhea.  Your CT scan as well as her labs were assuring.  This could still possibly be viral.  I have given you Bentyl medication for abdominal cramping as well as Zofran.  Zofran is a disintegrating tablet.  You places underneath your tongue.  Please try bland diet over the next 2 days.  Gradually increase your diet as your symptoms resolve.  If you continue to have symptoms please follow-up with your PCP for reevaluation.  Return to the ED for any new or worsening symptoms.

## 2018-07-01 NOTE — ED Provider Notes (Signed)
[facility] EMERGENCY DEPARTMENT Provider Note   CSN: [id number] Arrival date & time: 07/01/18  1606  History   Chief Complaint Chief Complaint  Patient presents with   Abdominal Pain   Diarrhea    HPI Jasmine Weber is a 48 y.o. female with past medical history significant for anxiety. Hyperlipidemia who presents for evaluation of abdominal pain and diarrhea. Pain began 1 week ago along with NB diarrhea. Pain located to lower quadrants. Denies epigastric or RUQ pain. Denies using the pain.  Pain is intermittent in nature.  Patient states she will randomly have abdominal cramping, felt nauseous and then have an episode of loose stool.  Patient states these episodes are not associated with food intake.  Denies melena or hematochezia.  She has had intermittent episodes of emesis, however has not had emesis in 24 hours.  Emesis NBNB.  Patient states her symptoms are worse over the first 5 days and have improved over the last 2 days, however she still continues to feel nausea as well as intermittent diarrhea.  Patient states she does have intermittent episodes of solid stool between her episodes of diarrhea.  Denies recent abx use or travel. Able to tolerate PO intake at home without difficulty, last ate boiled eggs for breakfast without emesis or diarrhea.  Denies fever, chills, headache, nasal congestion, rhinorrhea, sore throat, chest pain, shortness of breath, cough, reflux symptoms, dysuria, constipation, pelvic pain.  Patient states she has had 2 abnormal menstrual cycles over the last 2 months.  Patient states her normal menstrual cycles are 5 days now, however last month that lasted 7 days and this month her cycle is currently on day 7.  States she is able to change her feminine hygiene products every 5 hours. Denies large clots.  She does have history of known uterine fibroids. Denies pelvic pain. States she did have similar symptoms last month when she saw her PCP.  We will give her  Zofran for her nausea. Patient states is helped with her emesis, however did not help with her abdominal pain.  Patient states last month her symptoms of resolved.  Not tried anything for her pain.  She rates her current pain a 0/10.  She states when these weights abdominal cramping and diarrhea happen she rates her pain a 7/10.  Last PO intake 10 AM this morning.  Previous abdominal surgeries include lap tubal ligation.  Denies hx of known diverticulitis or diverticulosis. Denies previous colonoscopy.  PCP- Dr. Suzy Bouchard [facility], [facility]    HPI  Past Medical History:  Diagnosis Date   Anxiety    Hypercholesteremia     Patient Active Problem List   Diagnosis Date Noted   Traumatic hematoma of left wrist 07/04/2015   Abnormal Pap smear of cervix 04/05/2014   Obesity 04/05/2014    Past Surgical History:  Procedure Laterality Date   TUBAL LIGATION       OB History    Gravida  5   Para  3   Term  3   Preterm      AB  2   Living  3     SAB      TAB  2   Ectopic      Multiple      Live Births               Home Medications    Prior to Admission medications   Medication Sig Start Date End Date Taking? Authorizing Provider  DULoxetine (CYMBALTA) 20 MG capsule Take 20 mg by mouth 2 (two) times daily.   Yes [provider]  valACYclovir (VALTREX) 1000 MG tablet TK 1 T PO D 05/21/15  Yes [provider]  dicyclomine (BENTYL) 20 MG tablet Take 1 tablet (20 mg total) by mouth 2 (two) times daily. 07/01/18   Allin Frix A, PA-C  HYDROcodone-acetaminophen (NORCO/VICODIN) 5-325 MG tablet Take 1 tablet by mouth every 6 (six) hours as needed. 06/22/15   Horton, Barbette Hair, MD  ibuprofen (ADVIL,MOTRIN) 800 MG tablet Take 1 tablet (800 mg total) by mouth every 8 (eight) hours as needed. 08/19/16   Clayton Bibles, PA-C  lidocaine (LIDODERM) 5 % Place 1 patch onto the skin daily. Remove & Discard patch within 12 hours or as  directed by MD 08/19/16   Clayton Bibles, PA-C  methocarbamol (ROBAXIN) 500 MG tablet Take 2 tablets (1,000 mg total) by mouth 4 (four) times daily. 08/27/17   Carlisle Cater, PA-C  naproxen (NAPROSYN) 500 MG tablet Take 1 tablet (500 mg total) by mouth 2 (two) times daily. 08/27/17   Carlisle Cater, PA-C  naproxen (NAPROSYN) 500 MG tablet Take 1 tablet (500 mg total) by mouth 2 (two) times daily. 03/11/18   Horton, Barbette Hair, MD  ondansetron (ZOFRAN ODT) 4 MG disintegrating tablet Take 1 tablet (4 mg total) by mouth every 8 (eight) hours as needed for nausea or vomiting. 07/01/18   [place] Blizzard A, PA-C  predniSONE (DELTASONE) 20 MG tablet 3 Tabs PO Days 1-3, then 2 tabs PO Days 4-6, then 1 tab PO Day 7-9, then Half Tab PO Day 10-12 08/27/17   Carlisle Cater, PA-C  zolpidem (AMBIEN) 10 MG tablet Take 10 mg by mouth at bedtime as needed for sleep.    [provider]    Family History Family History  Problem Relation Age of Onset   Heart failure Mother    Hypertension Mother    Cancer Father        Prostate   Diabetes Maternal Grandmother    Cancer Maternal Grandfather     Social History Social History   Tobacco Use   Smoking status: Former Smoker   Smokeless tobacco: Never Used  Substance Use Topics   Alcohol use: Yes    Comment: rarely   Drug use: No     Allergies   Patient has no known allergies.   Review of Systems Review of Systems  Constitutional: Negative.   HENT: Negative.   Eyes: Negative.   Respiratory: Negative.   Cardiovascular: Negative.   Gastrointestinal: Positive for abdominal pain, diarrhea, nausea and vomiting. Negative for abdominal distention, anal bleeding, blood in stool, constipation and rectal pain.  Genitourinary: Negative.   Musculoskeletal: Negative.   Skin: Negative.   Neurological: Negative.   All other systems reviewed and are negative.    Physical Exam Updated Vital Signs BP 133/82 (BP Location: Right Arm)    Pulse 72     Temp 98.5 F (36.9 C) (Oral)    Resp 16    Ht 5\' 7"  (1.702 m)    Wt 90.7 kg    LMP 06/22/2018    SpO2 100%    BMI 31.32 kg/m   Physical Exam Vitals signs and nursing note reviewed.  Constitutional:      General: She is not in acute distress.    Appearance: She is well-developed. She is not ill-appearing, toxic-appearing or diaphoretic.  HENT:     Head: Normocephalic and atraumatic.  Mouth/Throat:     Comments: Posterior oropharynx clear.  Mucous membranes moist.  No drooling, dysphasia or trismus. Eyes:     Pupils: Pupils are equal, round, and reactive to light.     Comments: No conjunctival pallor.  Neck:     Musculoskeletal: Normal range of motion.  Cardiovascular:     Rate and Rhythm: Normal rate.     Heart sounds: Normal heart sounds.     Comments: No murmurs, rubs or gallops. Pulmonary:     Effort: No respiratory distress.     Comments: Clear to auscultation bilateral wheeze, rhonchi or rales.  No accessory muscle usage.  Speaking in full sentences without difficulty. Abdominal:     General: There is no distension.     Comments: Soft, nontender without rebound or guarding.  Normoactive bowel sounds.  No CVA tenderness.  Negative Murphy sign McBurney point. No overlying skin changes to abdomen. No evidence of abdominal wall herniations.  Musculoskeletal: Normal range of motion.  Skin:    General: Skin is warm.     Comments: Brisk capillary refill. Skin warm and dry. No rashes or lesions.  Neurological:     Mental Status: She is alert.    ED Treatments / Results  Labs (all labs ordered are listed, but only abnormal results are displayed) Labs Reviewed  CBC WITH DIFFERENTIAL/PLATELET - Abnormal; Notable for the following components:      Result Value   WBC 11.9 (*)    RDW 15.9 (*)    Neutro Abs 9.2 (*)    All other components within normal limits  COMPREHENSIVE METABOLIC PANEL - Abnormal; Notable for the following components:   Potassium 3.4 (*)    Calcium 8.6 (*)     All other components within normal limits  URINALYSIS, ROUTINE W REFLEX MICROSCOPIC - Abnormal; Notable for the following components:   Color, Urine RED (*)    APPearance TURBID (*)    Hgb urine dipstick LARGE (*)    Ketones, ur 15 (*)    Protein, ur 100 (*)    Leukocytes,Ua TRACE (*)    All other components within normal limits  URINALYSIS, MICROSCOPIC (REFLEX) - Abnormal; Notable for the following components:   Bacteria, UA RARE (*)    All other components within normal limits  LIPASE, BLOOD  PREGNANCY, URINE    EKG None  Radiology Ct Abdomen Pelvis W Contrast  Result Date: 07/01/2018 CLINICAL DATA:  Lower abdominal pain for 1 week with diarrhea and nausea. EXAM: CT ABDOMEN AND PELVIS WITH CONTRAST TECHNIQUE: Multidetector CT imaging of the abdomen and pelvis was performed using the standard protocol following bolus administration of intravenous contrast. CONTRAST:  160mL OMNIPAQUE IOHEXOL 300 MG/ML  SOLN COMPARISON:  Right upper quadrant ultrasound 07/15/2015. Pelvic ultrasound 10/02/2013. CT abdomen and pelvis 03/04/2010. FINDINGS: Lower chest: Minimal dependent atelectasis in the left lung base. No pleural effusion. Hepatobiliary: Mildly diminished attenuation of the liver diffusely which may reflect steatosis. No focal liver abnormality identified. Unremarkable gallbladder. No biliary dilatation. Pancreas: Unremarkable. Spleen: Unremarkable. Adrenals/Urinary Tract: Unremarkable adrenal glands. No evidence of renal mass, calculi, or hydronephrosis. Unremarkable bladder. Stomach/Bowel: The stomach is largely collapsed. No bowel dilatation or gross inflammatory changes are identified. The appendix is unremarkable. Vascular/Lymphatic: Mild abdominal aortic atherosclerosis without aneurysm. No enlarged lymph nodes. Reproductive: Uterine fibroids as previously characterized by ultrasound. 2.2 cm hypodensity associated with the left ovary which may represent a follicle or cyst. Other: No  intraperitoneal free fluid. Musculoskeletal: No acute osseous abnormality or suspicious  osseous lesion. IMPRESSION: 1. No acute abnormality identified in the abdomen or pelvis. 2. Fibroid uterus. 3. Suspected hepatic steatosis. 4.  Aortic Atherosclerosis (ICD10-I70.0). Electronically Signed   By: Logan Bores M.D.   On: 07/01/2018 19:38    Procedures Procedures (including critical care time)  Medications Ordered in ED Medications  sodium chloride 0.9 % bolus 1,000 mL (0 mLs Intravenous Stopped 07/01/18 1810)  ondansetron (ZOFRAN) injection 4 mg (4 mg Intravenous Given 07/01/18 1647)  dicyclomine (BENTYL) capsule 10 mg (10 mg Oral Given 07/01/18 1647)  iohexol (OMNIPAQUE) 300 MG/ML solution 100 mL (100 mLs Intravenous Contrast Given 07/01/18 1853)   Initial Impression / Assessment and Plan / ED Course  I have reviewed the triage vital signs and the nursing notes.  Pertinent labs & imaging results that were available during my care of the patient were reviewed by me and considered in my medical decision making (see chart for details).  48 year old who appears otherwise well presents for evaluation of abdominal pain.  Afebrile, nonseptic, non-ill-appearing.  Pain intermittent.  Associated nonbloody diarrhea and NBNB emesis.  Symptoms worse over the first 5 days, however have had improvement over the last 2 days.  Has had intermittent normal formed stools between episodes of diarrhea.  Denies recent travel or antibiotic use.  Able to tolerate p.o. intake at home without difficulty.  Mucous membranes moist.  Lungs clear.  Abdomen soft without rebound or guarding.  Non surgical abdomen.  Negative Murphy sign.  Symptoms not associated with food intake.  Low suspicion for gallbladder pathology as cause of her pain.  Has also had irregular menstrual cycles over the last 2 months. Known uterine fibroids. Denies lightheadedness or dizziness. Conjunctiva without pallor. Will obtain labs, antiemetics, IVF and  reevaluate.  1810: Improvement in nausea with Zofran.  States she "feels better."  CBC with leukocytosis at 11.9, hemoglobin 93.9, Metabolic panel with mild hypo-kalemia 3.4, lipase 25, hCG negative, urinalysis with trace leuks, rare bacteria.  She also has large blood, however patient is currently on her menstrual cycle.  This is likely the cause of her hematuria. CT scan pending.  1945: CT scan without any acute findings.  She does have a fibroid uterus.  On reevaluation abdomen soft, nontender without rebound or guarding.  Nonsurgical abdomen.  No focal tenderness to right or lower quadrant to indicate diverticulitis, appendicitis or ovarian pathology.  Low suspicion for TOA, PID, nephrolithiasis, torsion as cause of her pain.  Discussed with patient findings of urinalysis.  She has no urinary symptoms.  Patient does not want antibiotics for this at this time given no symptoms and feels the abx would worsen her diarrhea.  Feel this is reasonable.  Will p.o. trial and reevaluate.  2030: Patient able to tolerate p.o. intake in department without emesis, nausea or abdominal pain. Patient is nontoxic, nonseptic appearing, in no apparent distress.  Patient's pain and other symptoms adequately managed in emergency department.  Fluid bolus given.  Labs, imaging and vitals reviewed.  Patient does not meet the SIRS or Sepsis criteria.  On repeat exam patient does not have a surgical abdomin and there are no peritoneal signs.  No indication of appendicitis, bowel obstruction, bowel perforation, cholecystitis, diverticulitis, PID or ectopic pregnancy.  Patient discharged home with symptomatic treatment and given strict instructions for follow-up with their primary care physician.  I have also discussed reasons to return immediately to the ER.  Patient expresses understanding and agrees with plan.     Final Clinical  Impressions(s) / ED Diagnoses   Final diagnoses:  Lower abdominal pain  Nausea vomiting and  diarrhea    ED Discharge Orders         Ordered    dicyclomine (BENTYL) 20 MG tablet  2 times daily     07/01/18 1953    ondansetron (ZOFRAN ODT) 4 MG disintegrating tablet  Every 8 hours PRN,   Status:  Discontinued     07/01/18 1953    ondansetron (ZOFRAN ODT) 4 MG disintegrating tablet  Every 8 hours PRN     07/01/18 2023           Brandyn Lowrey A, PA-C 07/01/18 2035    Margette Fast, MD 07/01/18 2222

## 2018-07-01 NOTE — ED Notes (Signed)
Patient given PO fluids

## 2018-07-01 NOTE — ED Notes (Signed)
Patient reports improvement in nausea. Reports feeling better.

## 2018-07-01 NOTE — ED Notes (Signed)
Pt ambulatory to [facility] with stable condition.

## 2018-07-22 ENCOUNTER — Encounter (HOSPITAL_BASED_OUTPATIENT_CLINIC_OR_DEPARTMENT_OTHER): Payer: Self-pay | Admitting: Adult Health

## 2018-07-22 ENCOUNTER — Emergency Department (HOSPITAL_BASED_OUTPATIENT_CLINIC_OR_DEPARTMENT_OTHER): Payer: Self-pay

## 2018-07-22 ENCOUNTER — Other Ambulatory Visit: Payer: Self-pay

## 2018-07-22 ENCOUNTER — Emergency Department (HOSPITAL_BASED_OUTPATIENT_CLINIC_OR_DEPARTMENT_OTHER)
Admission: EM | Admit: 2018-07-22 | Discharge: 2018-07-22 | Disposition: A | Discharge status: Home / Self Care | Payer: Self-pay | Attending: Emergency Medicine | Admitting: Emergency Medicine

## 2018-07-22 DIAGNOSIS — N939 Abnormal uterine and vaginal bleeding, unspecified: Secondary | ICD-10-CM

## 2018-07-22 DIAGNOSIS — Z87891 Personal history of nicotine dependence: Secondary | ICD-10-CM | POA: Insufficient documentation

## 2018-07-22 DIAGNOSIS — Z79899 Other long term (current) drug therapy: Secondary | ICD-10-CM | POA: Insufficient documentation

## 2018-07-22 DIAGNOSIS — D259 Leiomyoma of uterus, unspecified: Secondary | ICD-10-CM | POA: Insufficient documentation

## 2018-07-22 DIAGNOSIS — N760 Acute vaginitis: Secondary | ICD-10-CM | POA: Insufficient documentation

## 2018-07-22 DIAGNOSIS — B9689 Other specified bacterial agents as the cause of diseases classified elsewhere: Secondary | ICD-10-CM

## 2018-07-22 HISTORY — DX: Benign neoplasm of connective and other soft tissue, unspecified: D21.9

## 2018-07-22 LAB — COMPREHENSIVE METABOLIC PANEL
ALT: 12 U/L (ref 0–44)
AST: 15 U/L (ref 15–41)
Albumin: 3.7 g/dL (ref 3.5–5.0)
Alkaline Phosphatase: 64 U/L (ref 38–126)
Anion gap: 7 (ref 5–15)
BUN: 8 mg/dL (ref 6–20)
CO2: 23 mmol/L (ref 22–32)
Calcium: 8.8 mg/dL — ABNORMAL LOW (ref 8.9–10.3)
Chloride: 105 mmol/L (ref 98–111)
Creatinine, Ser: 0.52 mg/dL (ref 0.44–1.00)
GFR calc Af Amer: >60 mL/min (ref >60)
GFR calc non Af Amer: >60 mL/min (ref >60)
Glucose, Bld: 100 mg/dL — ABNORMAL HIGH (ref 70–99)
Potassium: 3.8 mmol/L (ref 3.5–5.1)
Sodium: 135 mmol/L (ref 135–145)
Total Bilirubin: 0.5 mg/dL (ref 0.3–1.2)
Total Protein: 7.2 g/dL (ref 6.5–8.1)

## 2018-07-22 LAB — WET PREP, GENITAL
Sperm: NONE SEEN
Trich, Wet Prep: NONE SEEN
Yeast Wet Prep HPF POC: NONE SEEN

## 2018-07-22 LAB — CBC WITH DIFFERENTIAL/PLATELET
Abs Immature Granulocytes: 0.03 10*3/uL (ref 0.00–0.07)
Basophils Absolute: 0.1 10*3/uL (ref 0.0–0.1)
Basophils Relative: 1 %
Eosinophils Absolute: 0.1 10*3/uL (ref 0.0–0.5)
Eosinophils Relative: 1 %
HCT: 41.1 % (ref 36.0–46.0)
Hemoglobin: 13.7 g/dL (ref 12.0–15.0)
Immature Granulocytes: 0 %
Lymphocytes Relative: 19 %
Lymphs Abs: 1.7 10*3/uL (ref 0.7–4.0)
MCH: 27.6 pg (ref 26.0–34.0)
MCHC: 33.3 g/dL (ref 30.0–36.0)
MCV: 82.9 fL (ref 80.0–100.0)
Monocytes Absolute: 0.7 10*3/uL (ref 0.1–1.0)
Monocytes Relative: 8 %
Neutro Abs: 6.3 10*3/uL (ref 1.7–7.7)
Neutrophils Relative %: 71 %
Platelets: 359 10*3/uL (ref 150–400)
RBC: 4.96 MIL/uL (ref 3.87–5.11)
RDW: 15.9 % — ABNORMAL HIGH (ref 11.5–15.5)
WBC: 8.9 10*3/uL (ref 4.0–10.5)
nRBC: 0 % (ref 0.0–0.2)

## 2018-07-22 LAB — PREGNANCY, URINE: Preg Test, Ur: NEGATIVE

## 2018-07-22 MED ORDER — METRONIDAZOLE 500 MG PO TABS: 500.0000 mg | ORAL_TABLET | Freq: Two times a day (BID) | ORAL | 0 refills | Status: DC

## 2018-07-22 MED ORDER — MEGESTROL ACETATE 40 MG PO TABS: 40.0000 mg | ORAL_TABLET | Freq: Two times a day (BID) | ORAL | 0 refills | Status: AC

## 2018-07-22 NOTE — ED Notes (Signed)
Pt educated about wearing mask in room and when transported from room. Pt verbalized understanding

## 2018-07-22 NOTE — ED Notes (Signed)
ED Provider at bedside. 

## 2018-07-22 NOTE — Discharge Instructions (Addendum)
There is evidence of fibroids in the uterus.  This can be a source of bleeding.  Lab work is overall reassuring.  Follow-up with OB/GYN on this matter.  Call the number provided to set up an appointment.   Take the megestrol twice daily, as prescribed, until seen by OB/GYN. This should help with the vaginal bleeding. Should symptoms worsen, proceed to the new [facility].  There was evidence of bacterial vaginosis on the wet prep.  Do not drink alcohol with this antibiotic. Please take all of your antibiotics until finished!   You may develop abdominal discomfort or diarrhea from the antibiotic.  You may help offset this with probiotics which you can buy or get in yogurt. Do not eat or take the probiotics until 2 hours after your antibiotic.

## 2018-07-22 NOTE — ED Notes (Signed)
Patient transported to [facility] 
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
## 2018-07-22 NOTE — ED Triage Notes (Signed)
Presents with vaginal bleeding, fatigue, heavy clotting since the 29th of March. She has a Hx of Fibroids. She reports that her abdomen is distended more than normal and her stomach is sensitive. She is using varying degrees of flow from heavy to light.

## 2018-07-23 LAB — GC/CHLAMYDIA PROBE AMP (~~LOC~~) NOT AT ARMC
Chlamydia: NEGATIVE
Neisseria Gonorrhea: NEGATIVE

## 2018-07-23 LAB — HIV ANTIBODY (ROUTINE TESTING W REFLEX): HIV Screen 4th Generation wRfx: NONREACTIVE

## 2018-07-23 LAB — RPR: RPR Ser Ql: NONREACTIVE

## 2018-12-20 ENCOUNTER — Emergency Department (HOSPITAL_COMMUNITY): Payer: No Typology Code available for payment source

## 2018-12-20 ENCOUNTER — Other Ambulatory Visit: Payer: Self-pay

## 2018-12-20 ENCOUNTER — Emergency Department (HOSPITAL_COMMUNITY)
Admission: EM | Admit: 2018-12-20 | Discharge: 2018-12-21 | Disposition: A | Discharge status: Home / Self Care | Payer: No Typology Code available for payment source | Attending: Emergency Medicine | Admitting: Emergency Medicine

## 2018-12-20 ENCOUNTER — Encounter (HOSPITAL_COMMUNITY): Payer: Self-pay | Admitting: Emergency Medicine

## 2018-12-20 DIAGNOSIS — Y9389 Activity, other specified: Secondary | ICD-10-CM | POA: Insufficient documentation

## 2018-12-20 DIAGNOSIS — Y999 Unspecified external cause status: Secondary | ICD-10-CM | POA: Diagnosis not present

## 2018-12-20 DIAGNOSIS — S0990XA Unspecified injury of head, initial encounter: Secondary | ICD-10-CM

## 2018-12-20 DIAGNOSIS — S098XXA Other specified injuries of head, initial encounter: Secondary | ICD-10-CM | POA: Insufficient documentation

## 2018-12-20 DIAGNOSIS — M5441 Lumbago with sciatica, right side: Secondary | ICD-10-CM

## 2018-12-20 DIAGNOSIS — Z79899 Other long term (current) drug therapy: Secondary | ICD-10-CM | POA: Insufficient documentation

## 2018-12-20 DIAGNOSIS — Z87891 Personal history of nicotine dependence: Secondary | ICD-10-CM | POA: Diagnosis not present

## 2018-12-20 DIAGNOSIS — Y92410 Unspecified street and highway as the place of occurrence of the external cause: Secondary | ICD-10-CM | POA: Insufficient documentation

## 2018-12-20 LAB — POC URINE PREG, ED: Preg Test, Ur: NEGATIVE

## 2018-12-20 MED ORDER — METHOCARBAMOL 500 MG PO TABS
500.0000 mg | ORAL_TABLET | Freq: Once | ORAL | Status: AC
  Administered 2018-12-20: 500 mg via ORAL
  Filled 2018-12-20: qty 1

## 2018-12-20 MED ORDER — IBUPROFEN 200 MG PO TABS
600.0000 mg | ORAL_TABLET | Freq: Once | ORAL | Status: AC
  Administered 2018-12-20: 600 mg via ORAL
  Filled 2018-12-20: qty 3

## 2018-12-20 MED ORDER — METHOCARBAMOL 500 MG PO TABS: 500.0000 mg | ORAL_TABLET | Freq: Two times a day (BID) | ORAL | 0 refills | Status: DC
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
## 2018-12-20 NOTE — Discharge Instructions (Signed)
The pain you are experiencing is likely due to muscle strain, you may take Ibuprofen or Naprosyn and Robaxin as needed for pain management. Do not combine with any pain reliever other than tylenol.  You may also use ice and heat, and over-the-counter remedies such as Biofreeze gel or salon pas lidocaine patches. The muscle soreness should improve over the next week. Follow up with your family doctor in the next week for a recheck if you are still having symptoms. Return to ED if pain is worsening, you develop weakness or numbness of extremities, or new or concerning symptoms develop.
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
## 2019-01-31 IMAGING — CT CT ANGIO CHEST
2 of 8 series · 19 of 36 positions shown · IV contrast (iopamidol)
Comparison: Chest radiograph performed earlier today at [DATE] p.m.

CLINICAL DATA: Subacute onset of midsternal chest pain. Mild
shortness of breath.

EXAM:
CT ANGIOGRAPHY CHEST WITH CONTRAST
TECHNIQUE: Multidetector CT imaging of the chest was performed using the
standard protocol during bolus administration of intravenous
contrast. Multiplanar CT image reconstructions and MIPs were
obtained to evaluate the vascular anatomy.
CONTRAST:  100mL [id number] IOPAMIDOL ([id number]) INJECTION 76%

[Series 6: pe thins · axial · 0.64mm/px · z∈[-118,+145]mm · 18 of 295 slices shown]
[im 16/295  lung]
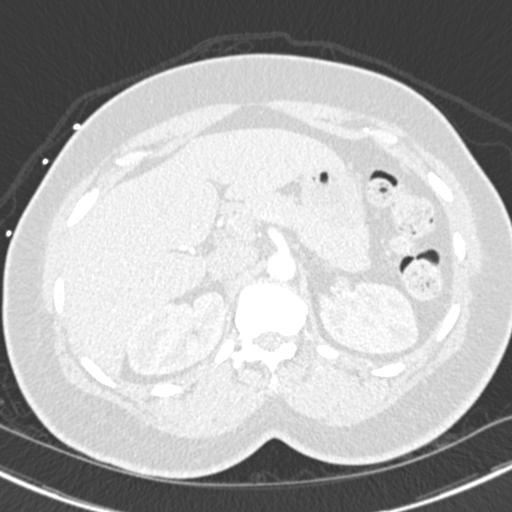
[im 31/295  mediastinal]
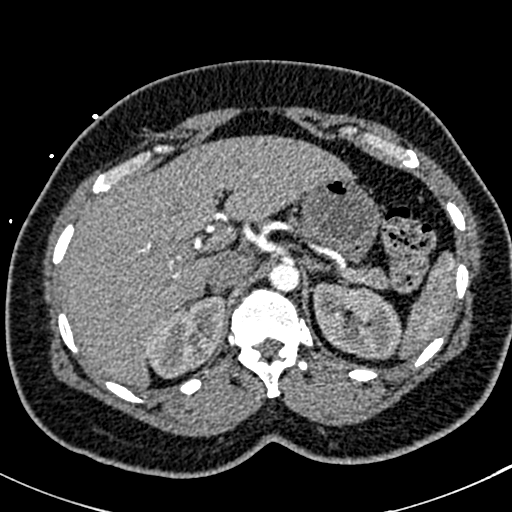
[im 47/295  lung]
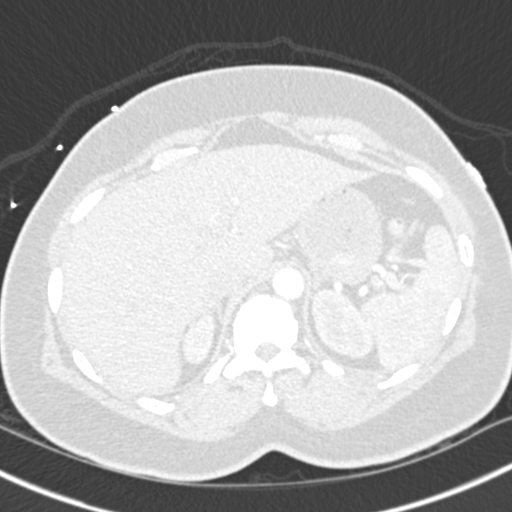
[im 62/295  mediastinal]
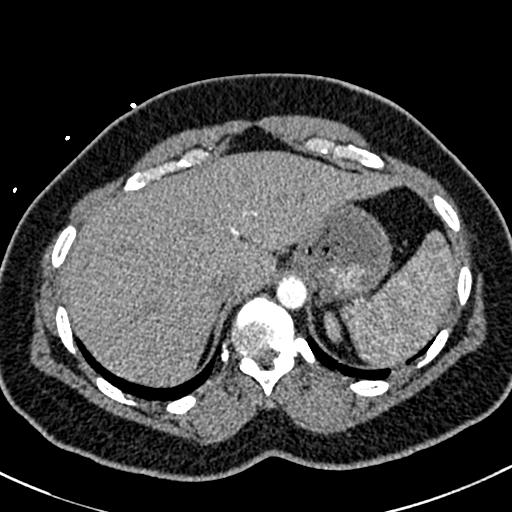
[im 78/295  lung]
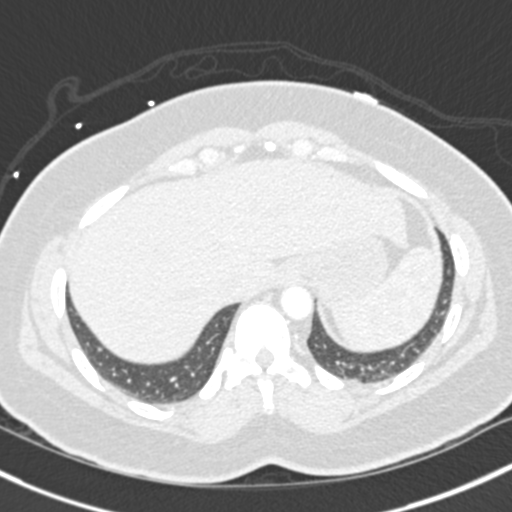
[im 93/295  mediastinal]
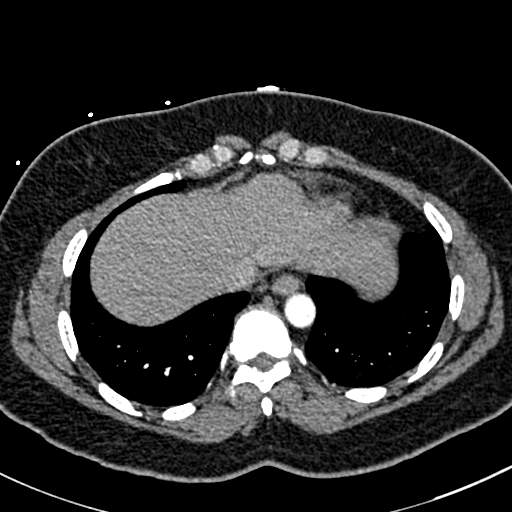
[im 109/295  lung]
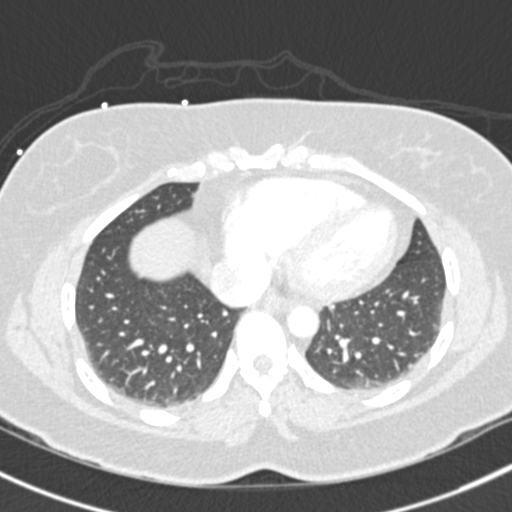
[im 124/295  mediastinal]
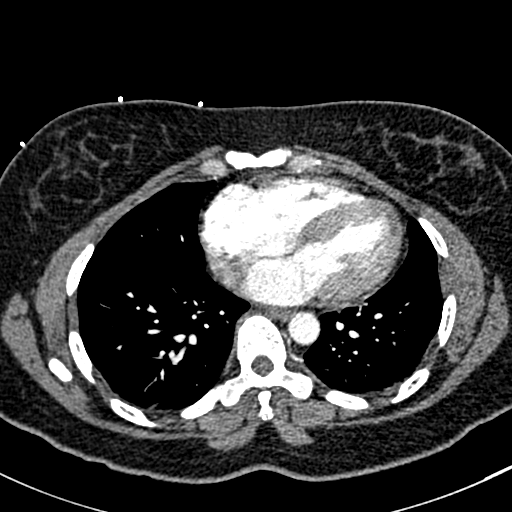
[im 140/295  lung]
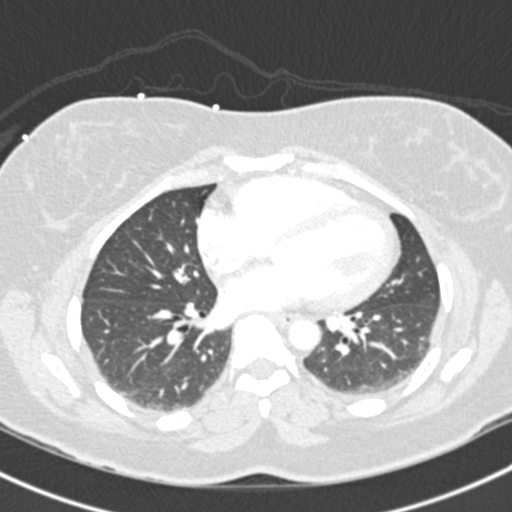
[im 155/295  mediastinal]
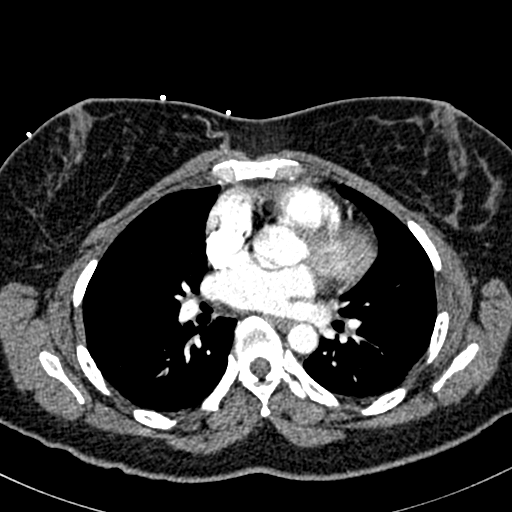
[im 171/295  lung]
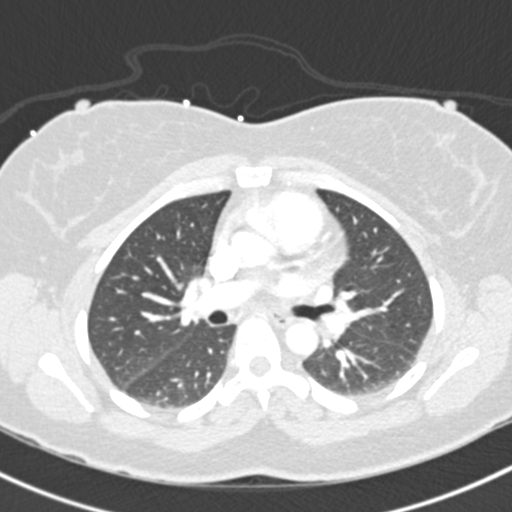
[im 186/295  mediastinal]
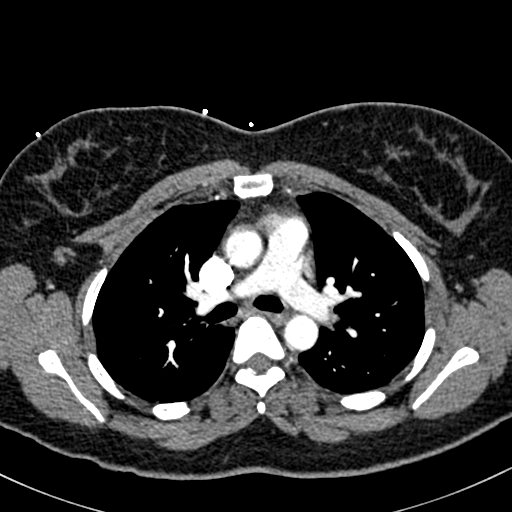
[im 202/295  lung]
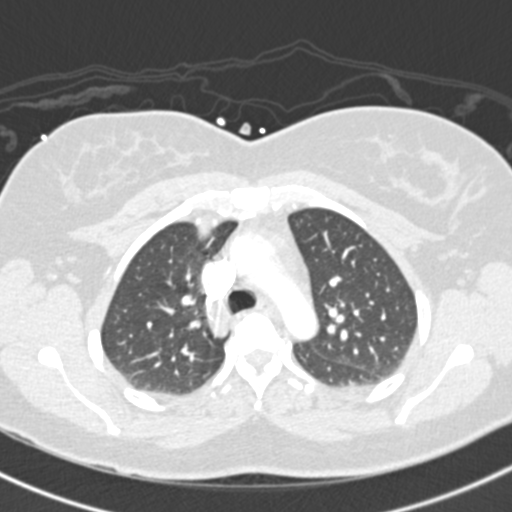
[im 217/295  mediastinal]
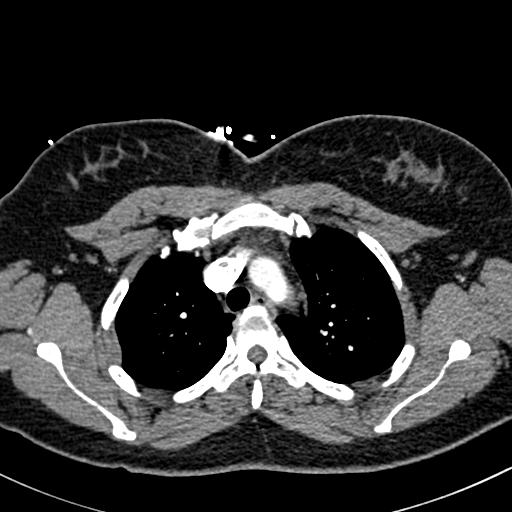
[im 233/295  lung]
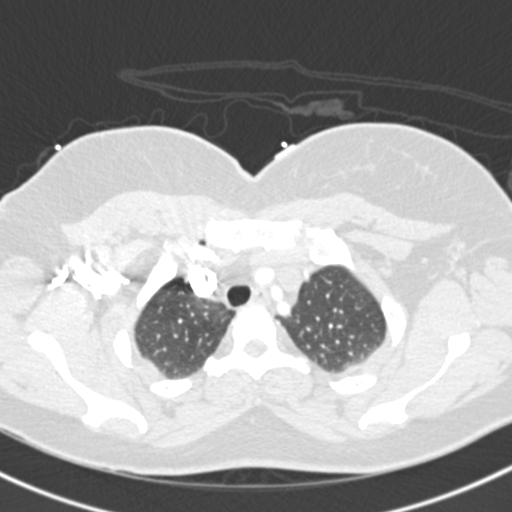
[im 248/295  mediastinal]
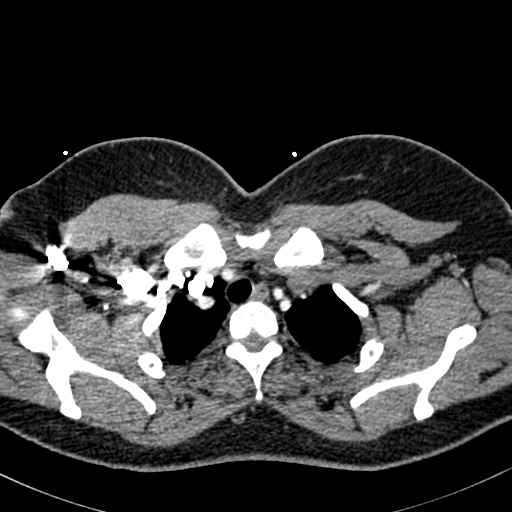
[im 264/295  lung]
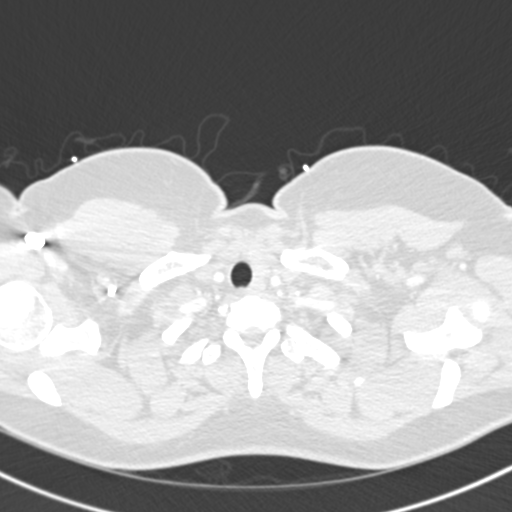
[im 279/295  mediastinal]
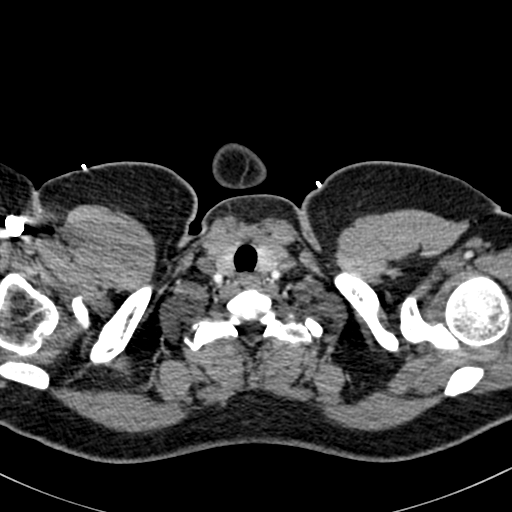

[Series 7: pe coronal mpr · coronal · 0.59mm/px · 1 of 132 slices shown]
[im 66/132  mediastinal]
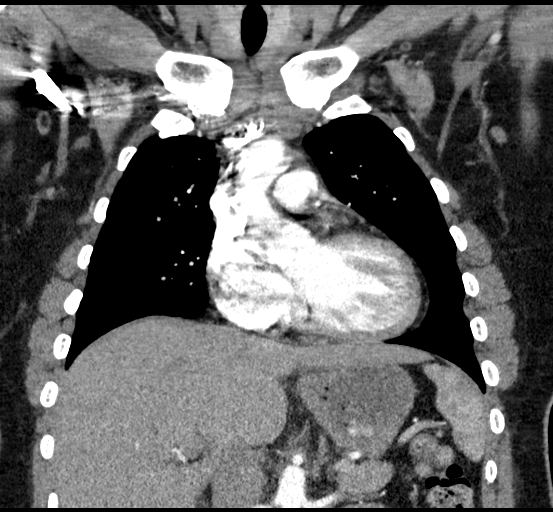

[19 of 36 positions shown; findings below may reference images not displayed]

FINDINGS: Cardiovascular:  There is no evidence of pulmonary embolus.

The heart is normal in size. The thoracic aorta is unremarkable. The
great vessels are within normal limits.

Mediastinum/Nodes: The mediastinum is unremarkable appearance. No
mediastinal lymphadenopathy is seen. No pericardial effusion is
identified. The visualized portions of the thyroid gland are
unremarkable. No axillary lymphadenopathy is appreciated.

Lungs/Pleura: The lungs are clear bilaterally. No focal
consolidation, pleural effusion or pneumothorax is seen. No masses
are identified.

Upper Abdomen: The visualized portions of the liver and spleen are
unremarkable. The visualized portions of the pancreas, adrenal
glands and kidneys are within normal limits.

Musculoskeletal: No acute osseous abnormalities are identified. The
visualized musculature is unremarkable in appearance.

Review of the MIP images confirms the above findings.
IMPRESSION: No evidence of pulmonary embolus.  Lungs clear bilaterally.

## 2019-07-04 IMAGING — CR DG CHEST 2V
2 series · 2 of 2 positions shown · non-contrast
Comparison: None.

CLINICAL DATA: Shortness of breath

EXAM:
CHEST - 2 VIEW

[w chest pa]
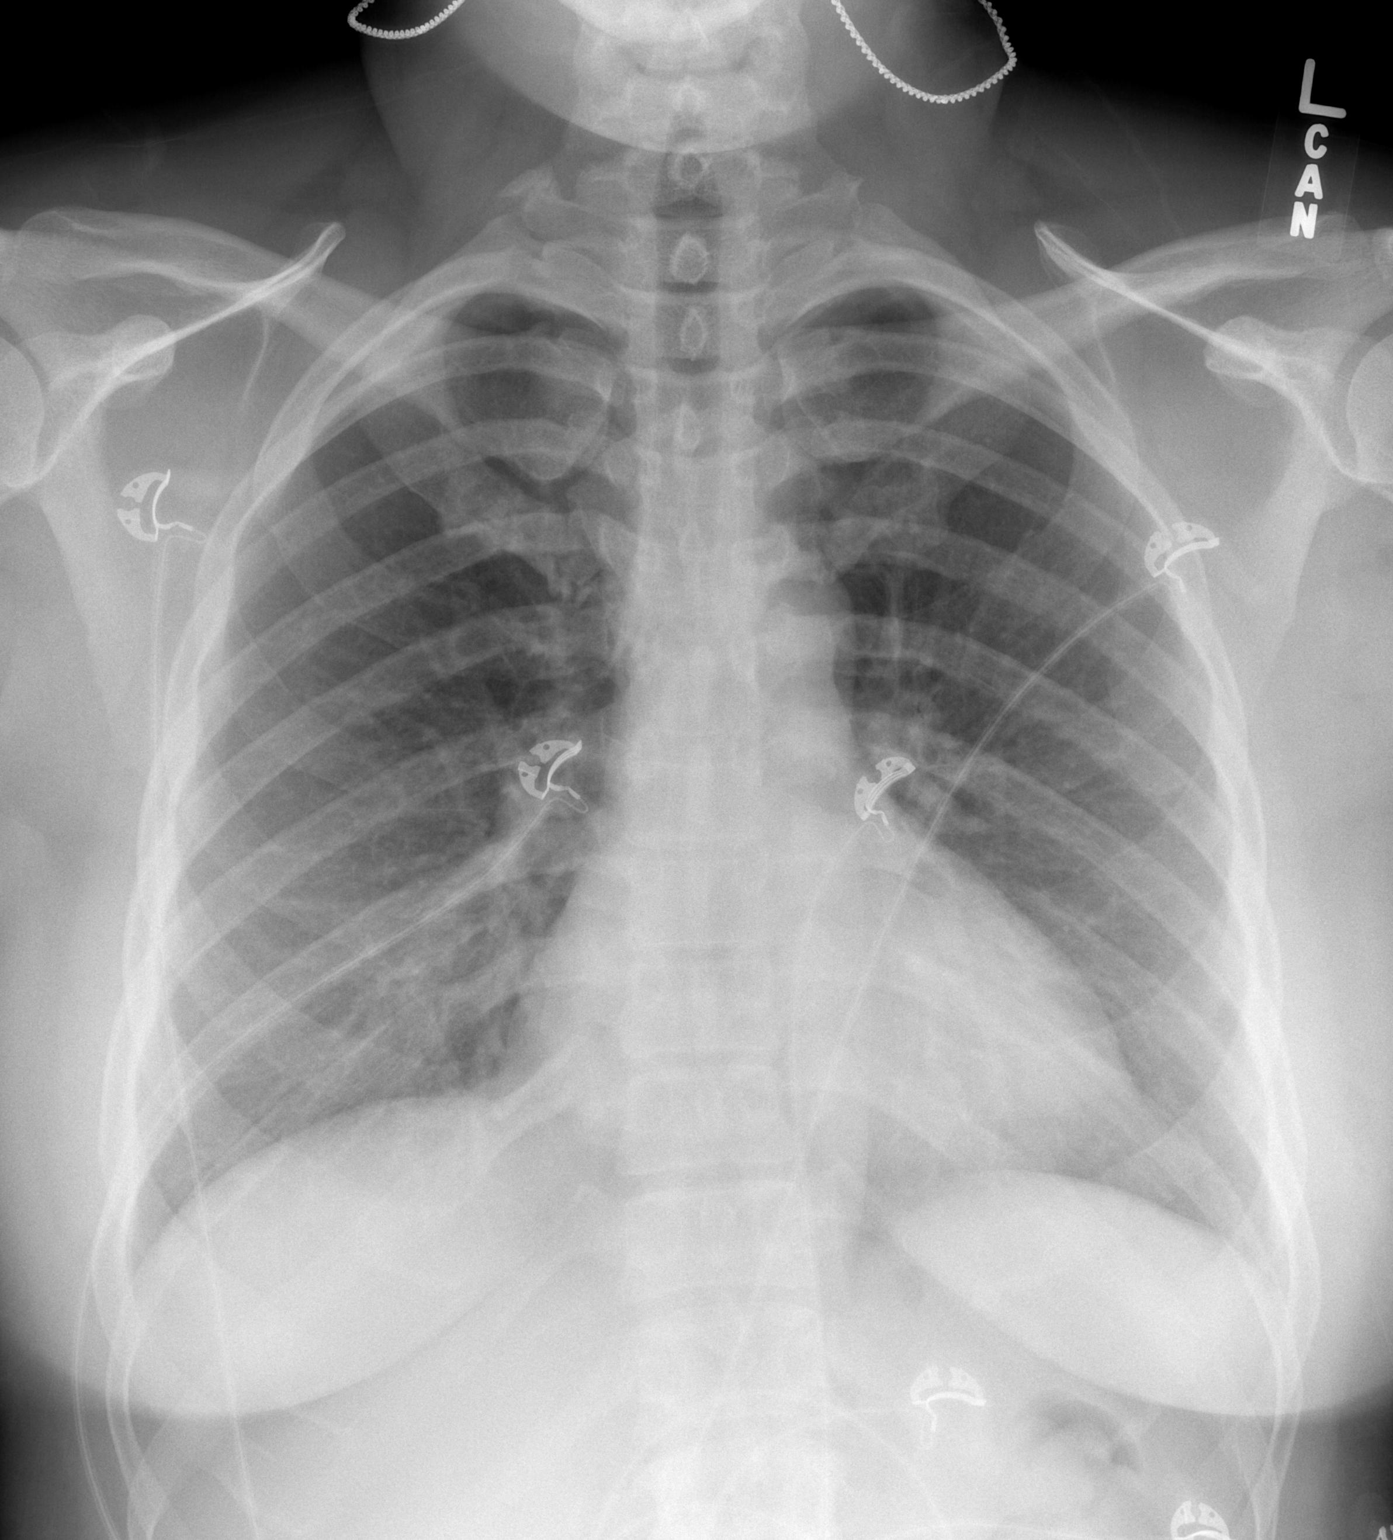

[w chest lat]
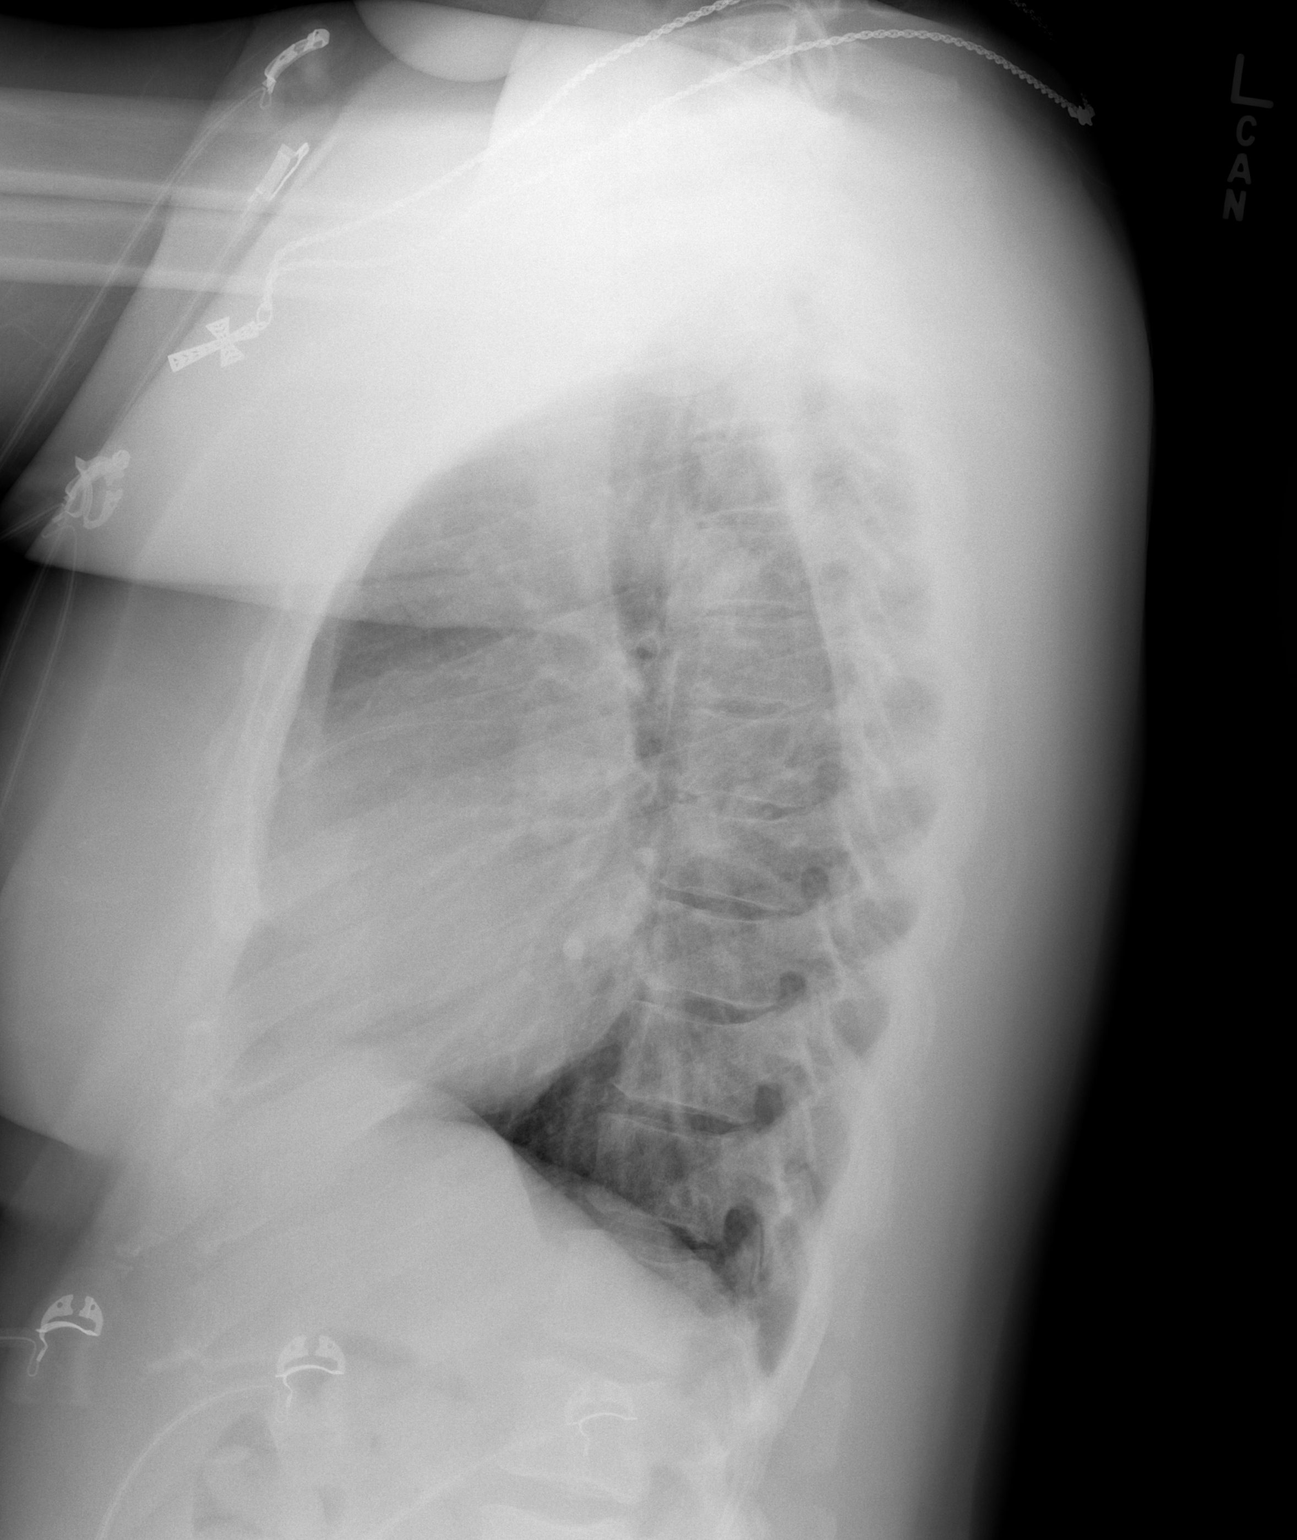

[2 of 2 positions shown; findings below may reference images not displayed]

FINDINGS: The heart size and mediastinal contours are within normal limits.
Both lungs are clear. The visualized skeletal structures are
unremarkable.
IMPRESSION: No active cardiopulmonary disease.

## 2019-10-30 ENCOUNTER — Emergency Department (HOSPITAL_BASED_OUTPATIENT_CLINIC_OR_DEPARTMENT_OTHER)
Admission: EM | Admit: 2019-10-30 | Discharge: 2019-10-30 | Disposition: A | Discharge status: Home / Self Care | Payer: BC Managed Care – PPO | Attending: Emergency Medicine | Admitting: Emergency Medicine

## 2019-10-30 ENCOUNTER — Emergency Department (HOSPITAL_BASED_OUTPATIENT_CLINIC_OR_DEPARTMENT_OTHER): Payer: BC Managed Care – PPO

## 2019-10-30 ENCOUNTER — Encounter (HOSPITAL_BASED_OUTPATIENT_CLINIC_OR_DEPARTMENT_OTHER): Payer: Self-pay | Admitting: *Deleted

## 2019-10-30 ENCOUNTER — Other Ambulatory Visit: Payer: Self-pay

## 2019-10-30 DIAGNOSIS — M545 Low back pain, unspecified: Secondary | ICD-10-CM

## 2019-10-30 DIAGNOSIS — Z87891 Personal history of nicotine dependence: Secondary | ICD-10-CM | POA: Insufficient documentation

## 2019-10-30 DIAGNOSIS — R0789 Other chest pain: Secondary | ICD-10-CM | POA: Insufficient documentation

## 2019-10-30 LAB — TROPONIN I (HIGH SENSITIVITY): Troponin I (High Sensitivity): <2 ng/L (ref <18)

## 2019-10-30 MED ORDER — METHOCARBAMOL 750 MG PO TABS
750.0000 mg | ORAL_TABLET | Freq: Three times a day (TID) | ORAL | 0 refills | Status: DC | PRN
Start: 2019-10-30 — End: 2021-12-15

## 2019-10-30 MED ORDER — IBUPROFEN 600 MG PO TABS: 600.0000 mg | ORAL_TABLET | Freq: Three times a day (TID) | ORAL | 0 refills | Status: DC | PRN
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
## 2019-10-30 NOTE — ED Triage Notes (Signed)
Pt reports low back spasms and pain x "weeks", yesterday noticed tenderness and pain in her mid chest area that increases with certain movements and positions, she wonders if she has pulled a muscle in her chest due to compensating for her back pain. ekg performed at triage and shown to edp by fernando, emt. Pt denies any nausea, dizzyness, sob or any other c/o.

## 2019-10-30 NOTE — Discharge Instructions (Addendum)
It was our pleasure to provide your ER care today - we hope that you feel better.  Your lab work/heart test is normal.   Take ibuprofen as need for pain. Take robaxin as need for muscle pain/spasm - no driving when taking.   Avoid bending at waist, or heavy lifting > 20 lbs for the next week. Try heat therapy.   Follow up with primary care doctor in the next 2-3 weeks.   Return to ER if worse, new symptoms, fevers, severe or intractable pain, trouble breathing, or other concern.

## 2019-11-27 ENCOUNTER — Other Ambulatory Visit: Payer: Self-pay

## 2019-11-27 ENCOUNTER — Emergency Department (HOSPITAL_BASED_OUTPATIENT_CLINIC_OR_DEPARTMENT_OTHER)
Admission: EM | Admit: 2019-11-27 | Discharge: 2019-11-27 | Disposition: A | Discharge status: Home / Self Care | Payer: HRSA Program | Attending: Emergency Medicine | Admitting: Emergency Medicine

## 2019-11-27 ENCOUNTER — Encounter (HOSPITAL_BASED_OUTPATIENT_CLINIC_OR_DEPARTMENT_OTHER): Payer: Self-pay

## 2019-11-27 ENCOUNTER — Emergency Department (HOSPITAL_BASED_OUTPATIENT_CLINIC_OR_DEPARTMENT_OTHER): Payer: HRSA Program

## 2019-11-27 DIAGNOSIS — Z87891 Personal history of nicotine dependence: Secondary | ICD-10-CM | POA: Insufficient documentation

## 2019-11-27 DIAGNOSIS — J069 Acute upper respiratory infection, unspecified: Secondary | ICD-10-CM

## 2019-11-27 DIAGNOSIS — R05 Cough: Secondary | ICD-10-CM | POA: Diagnosis present

## 2019-11-27 DIAGNOSIS — Z20822 Contact with and (suspected) exposure to covid-19: Secondary | ICD-10-CM | POA: Insufficient documentation

## 2019-11-27 LAB — SARS CORONAVIRUS 2 BY RT PCR (HOSPITAL ORDER, PERFORMED IN ~~LOC~~ HOSPITAL LAB): SARS Coronavirus 2: NEGATIVE

## 2019-11-27 NOTE — ED Triage Notes (Signed)
Pt c/o cough, sore throat, sneezing, loss of taste and smell, decreased appetite, subjective fevers. + COVID contacts.

## 2019-11-27 NOTE — Discharge Instructions (Signed)
Your Covid test today was negative.  Please make sure to drink plenty of fluids, you may take over-the-counter Flonase, Mucinex, Zyrtec and ibuprofen/Tylenol for your symptoms.  Return to the ER if your symptoms worsen.

## 2019-11-27 NOTE — ED Provider Notes (Signed)
[facility] Provider Note   CSN: [id number] Arrival date & time: 11/27/19  1612     History Chief Complaint  Patient presents with  . Cough    [facility] is a 49 y.o. female.  HPI 49 year old female with history of anxiety, fibroids, hypercholesteremia presents to the ER with sore throat, cough, nasal congestion, loss of taste and smell, decreased appetite and chills but no measurable fevers.  She is vaccinated but states that she has been around some positive Covid patients recently.  She is here for Covid test.  Denies any chest pain or shortness of breath.  Denies any abdominal pain, nausea or vomiting.  No diarrhea.    Past Medical History:  Diagnosis Date  . Anxiety   . Fibroids   . Hypercholesteremia     Patient Active Problem List   Diagnosis Date Noted  . Traumatic hematoma of left wrist 07/04/2015  . Abnormal Pap smear of cervix 04/05/2014  . Obesity 04/05/2014    Past Surgical History:  Procedure Laterality Date  . TUBAL LIGATION    . WRIST SURGERY Right    tendonitis     OB History    Gravida  5   Para  3   Term  3   Preterm      AB  2   Living  3     SAB      TAB  2   Ectopic      Multiple      Live Births              Family History  Problem Relation Age of Onset  . Heart failure Mother   . Hypertension Mother   . Cancer Father        Prostate  . Diabetes Maternal Grandmother   . Cancer Maternal Grandfather     Social History   Tobacco Use  . Smoking status: Former Research scientist (life sciences)  . Smokeless tobacco: Never Used  Vaping Use  . Vaping Use: Every day  Substance Use Topics  . Alcohol use: Yes    Comment: rarely  . Drug use: Yes    Types: Marijuana    Home Medications Prior to Admission medications   Medication Sig Start Date End Date Taking? Authorizing Provider  ibuprofen (ADVIL) 600 MG tablet Take 1 tablet (600 mg total) by mouth every 8 (eight) hours as needed. Take with food.  10/30/19   Lajean Saver, MD  methocarbamol (ROBAXIN) 750 MG tablet Take 1 tablet (750 mg total) by mouth 3 (three) times daily as needed (muscle spasm/pain). 10/30/19   Lajean Saver, MD  dicyclomine (BENTYL) 20 MG tablet Take 1 tablet (20 mg total) by mouth 2 (two) times daily. 07/01/18 10/30/19  Henderly, Britni A, PA-C  DULoxetine (CYMBALTA) 20 MG capsule Take 20 mg by mouth 2 (two) times daily.  10/30/19  [provider]  zolpidem (AMBIEN) 10 MG tablet Take 10 mg by mouth at bedtime as needed for sleep.  10/30/19  [provider]    Allergies    Patient has no known allergies.  Review of Systems   Review of Systems  Constitutional: Positive for activity change, appetite change, chills, fatigue and fever.  HENT: Positive for congestion, sneezing and sore throat. Negative for voice change.   Respiratory: Positive for cough. Negative for shortness of breath.   Cardiovascular: Negative for chest pain.  Gastrointestinal: Negative for abdominal pain, diarrhea, nausea and vomiting.  Genitourinary: Negative for dysuria.  Musculoskeletal: Negative for back pain.  Neurological: Positive for weakness. Negative for headaches.    Physical Exam Updated Vital Signs BP (!) 153/100 (BP Location: Left Arm)   Pulse 99   Temp 98.7 F (37.1 C) (Oral)   Resp 20   Ht 5\' 8"  (1.727 m)   Wt 90.7 kg   LMP 11/27/2019   SpO2 98%   BMI 30.41 kg/m   Physical Exam Vitals and nursing note reviewed.  Constitutional:      General: She is not in acute distress.    Appearance: She is well-developed. She is not ill-appearing, toxic-appearing or diaphoretic.  HENT:     Head: Normocephalic and atraumatic.     Mouth/Throat:     Mouth: Mucous membranes are moist.     Pharynx: No posterior oropharyngeal erythema.     Comments: Oropharynx non erythematous without exudates, uvula midline, no unilateral tonsillar swelling, tongue normal size and midline, no sublingual/submandibular swellimg, tolerating  secretions well    Eyes:     Conjunctiva/sclera: Conjunctivae normal.  Cardiovascular:     Rate and Rhythm: Normal rate and regular rhythm.     Pulses: Normal pulses.     Heart sounds: Normal heart sounds. No murmur heard.   Pulmonary:     Effort: Pulmonary effort is normal. No respiratory distress.     Breath sounds: Normal breath sounds.  Abdominal:     General: Abdomen is flat.     Palpations: Abdomen is soft.     Tenderness: There is no abdominal tenderness.  Musculoskeletal:        General: No tenderness.     Cervical back: Normal range of motion and neck supple.     Right lower leg: No edema.     Left lower leg: No edema.  Skin:    General: Skin is warm and dry.     Findings: No erythema or rash.  Neurological:     General: No focal deficit present.     Mental Status: She is alert and oriented to person, place, and time.  Psychiatric:        Mood and Affect: Mood normal.        Behavior: Behavior normal.     ED Results / Procedures / Treatments   Labs (all labs ordered are listed, but only abnormal results are displayed) Labs Reviewed  SARS CORONAVIRUS 2 BY RT PCR (HOSPITAL ORDER, [facility])    EKG None  Radiology DG Chest Portable 1 View  Result Date: 11/27/2019 CLINICAL DATA:  Shortness of breath EXAM: PORTABLE CHEST 1 VIEW COMPARISON:  10/30/2019 FINDINGS: The heart size and mediastinal contours are within normal limits. Both lungs are clear. The visualized skeletal structures are unremarkable. IMPRESSION: No active disease. Electronically Signed   By: Donavan Foil M.D.   On: 11/27/2019 17:17    Procedures Procedures (including critical care time)  Medications Ordered in ED Medications - No data to display  ED Course  I have reviewed the triage vital signs and the nursing notes.  Pertinent labs & imaging results that were available during my care of the patient were reviewed by me and considered in my medical decision  making (see chart for details).    MDM Rules/Calculators/A&P                          49 year old female with Covid-like symptoms times several days Covid test is negative here.  No evidence of  pneumonia on chest x-ray.  Patient's vitals are reassuring, she is not hypoxic, not complaining of chest pain, shortness of breath.  No evidence of erythema to the oropharynx, no evidence of peritonsillar abscess, retropharyngeal abscess, Ludwig's.  Overall well-appearing.  Patient educated on supportive measures.  Return precautions discussed.  She is overall reassured, voices understanding and is agreeable to this plan.  At this stage in the ED course, the patient medically screened and stable for discharge.  [facility] was evaluated in Emergency Department on 11/27/2019 for the symptoms described in the history of present illness. She was evaluated in the context of the global COVID-19 pandemic, which necessitated consideration that the patient might be at risk for infection with the SARS-CoV-2 virus that causes COVID-19. Institutional protocols and algorithms that pertain to the evaluation of patients at risk for COVID-19 are in a state of rapid change based on information released by regulatory bodies including the CDC and federal and state organizations. These policies and algorithms were followed during the patient's care in the ED.` Final Clinical Impression(s) / ED Diagnoses Final diagnoses:  Viral URI with cough    Rx / DC Orders ED Discharge Orders    None       Garald Balding, PA-C 11/27/19 1956    Deno Etienne, DO 11/27/19 1958

## 2020-03-24 ENCOUNTER — Other Ambulatory Visit: Payer: Self-pay

## 2020-03-24 ENCOUNTER — Telehealth: Payer: Self-pay | Admitting: Physician Assistant

## 2020-03-24 DIAGNOSIS — R110 Nausea: Secondary | ICD-10-CM

## 2020-03-24 DIAGNOSIS — J069 Acute upper respiratory infection, unspecified: Secondary | ICD-10-CM

## 2020-03-24 MED ORDER — ONDANSETRON 4 MG PO TBDP: 4.0000 mg | ORAL_TABLET | Freq: Three times a day (TID) | ORAL | 0 refills | Status: DC | PRN

## 2020-03-24 MED ORDER — AZITHROMYCIN 250 MG PO TABS: ORAL_TABLET | ORAL | 0 refills | Status: DC

## 2020-03-24 MED ORDER — GUAIFENESIN ER 600 MG PO TB12: 600.0000 mg | ORAL_TABLET | Freq: Two times a day (BID) | ORAL | 0 refills | Status: AC | PRN

## 2020-03-24 NOTE — Progress Notes (Signed)
Patient verified DOB Patient complains of symptoms beginning last Monday. Patient complains of productive yellow brownish color. Patient has nausea and vomiting with no energy. Patient has taste and smell present. Patient husband is experiencing similar symptoms. Patient was exposed to a friend who is currently positive

## 2020-03-24 NOTE — Progress Notes (Signed)
Acute Office Visit  Subjective:    Patient ID: [facility], female    DOB: [birth year], 49 y.o.   MRN: [id number]  Chief Complaint  Patient presents with   Covid Exposure  Virtual Visit via Telephone Note  I connected with Jasmine [facility] on 03/24/20 at  2:00 PM EST by telephone and verified that I am speaking with the correct person using two identifiers.  Location: Patient: car Provider: [facility]    I discussed the limitations, risks, security and privacy concerns of performing an evaluation and management service by telephone and the availability of in person appointments. I also discussed with the patient that there may be a patient responsible charge related to this service. The patient expressed understanding and agreed to proceed.   History of Present Illness: Reports that she started having a productive cough with brownish sputum approximately 12 days ago.  Reports that she has been having difficulty sleeping due to the cough.  Endorses on and off chills, denies fever.  Reports that she has been having episodes of nausea with vomiting.  Is able to keep some food down, is able to stay hydrated.  Reports that she has been using Benadryl without much relief.  Does endorse coworker who recently tested positive for Covid.  Reports that she has had 2 Covid vaccines, last dose October 08, 2019.  Reports recent flu shot.    Observations/Objective: Medical history and current medications reviewed, no physical exam completed     Past Medical History:  Diagnosis Date   Anxiety    Fibroids    Hypercholesteremia     Past Surgical History:  Procedure Laterality Date   TUBAL LIGATION     WRIST SURGERY Right    tendonitis    Family History  Problem Relation Age of Onset   Heart failure Mother    Hypertension Mother    Cancer Father        Prostate   Diabetes Maternal Grandmother    Cancer Maternal Grandfather     Social History    Socioeconomic History   Marital status: Married    Spouse name: Not on file   Number of children: Not on file   Years of education: Not on file   Highest education level: Not on file  Occupational History   Not on file  Tobacco Use   Smoking status: Former Smoker   Smokeless tobacco: Never Used  Building services engineer Use: Every day  Substance and Sexual Activity   Alcohol use: Yes    Comment: rarely   Drug use: Yes    Types: Marijuana   Sexual activity: Not Currently    Birth control/protection: Surgical  Other Topics Concern   Not on file  Social History Narrative   Not on file   Social Determinants of Health   Financial Resource Strain: Not on file  Food Insecurity: Not on file  Transportation Needs: Not on file  Physical Activity: Not on file  Stress: Not on file  Social Connections: Not on file  Intimate Partner Violence: Not on file    Outpatient Medications Prior to Visit  Medication Sig Dispense Refill   ibuprofen (ADVIL) 600 MG tablet Take 1 tablet (600 mg total) by mouth every 8 (eight) hours as needed. Take with food. 15 tablet 0   methocarbamol (ROBAXIN) 750 MG tablet Take 1 tablet (750 mg total) by mouth 3 (three) times daily as needed (muscle spasm/pain). 15 tablet 0  No facility-administered medications prior to visit.    No Known Allergies  Review of Systems  Constitutional: Positive for chills. Negative for fever.  HENT: Positive for congestion. Negative for ear pain, sinus pressure and trouble swallowing.   Eyes: Negative.   Respiratory: Positive for cough. Negative for shortness of breath and wheezing.   Cardiovascular: Negative for chest pain and palpitations.  Gastrointestinal: Positive for nausea and vomiting. Negative for abdominal pain.  Endocrine: Negative.   Genitourinary: Negative.   Musculoskeletal: Positive for myalgias.  Skin: Negative.   Allergic/Immunologic: Negative.   Neurological: Negative.   Hematological:  Negative.   Psychiatric/Behavioral: Negative.        Objective:     There were no vitals taken for this visit. Wt Readings from Last 3 Encounters:  11/27/19 200 lb (90.7 kg)  10/30/19 200 lb (90.7 kg)  12/20/18 182 lb (82.6 kg)    Health Maintenance Due  Topic Date Due   Hepatitis C Screening  Never done   COVID-19 Vaccine (1) Never done   TETANUS/TDAP  Never done   PAP SMEAR-Modifier  01/14/2017   COLONOSCOPY (Pts 45-12yrs Insurance coverage will need to be confirmed)  Never done   INFLUENZA VACCINE  10/25/2019    There are no preventive care reminders to display for this patient.   No results found for: TSH Lab Results  Component Value Date   WBC 8.9 07/22/2018   HGB 13.7 07/22/2018   HCT 41.1 07/22/2018   MCV 82.9 07/22/2018   PLT 359 07/22/2018   Lab Results  Component Value Date   NA 135 07/22/2018   K 3.8 07/22/2018   CO2 23 07/22/2018   GLUCOSE 100 (H) 07/22/2018   BUN 8 07/22/2018   CREATININE 0.52 07/22/2018   BILITOT 0.5 07/22/2018   ALKPHOS 64 07/22/2018   AST 15 07/22/2018   ALT 12 07/22/2018   PROT 7.2 07/22/2018   ALBUMIN 3.7 07/22/2018   CALCIUM 8.8 (L) 07/22/2018   ANIONGAP 7 07/22/2018   No results found for: CHOL No results found for: HDL No results found for: LDLCALC No results found for: TRIG No results found for: CHOLHDL No results found for: HGBA1C     Assessment & Plan:   Problem List Items Addressed This Visit   None    Assessment and Plan: 1. Acute upper respiratory infection POC Covid testing negative.  Patient education given.  Trial azithromycin, Mucinex, Zofran.  Red flag warnings given for prompt reevaluation.    azithromycin (ZITHROMAX) 250 MG tablet; Take 2 tabs po day 1, then take 1 tab po daily  Dispense: 6 tablet; Refill: 0 - guaiFENesin (MUCINEX) 600 MG 12 hr tablet; Take 1 tablet (600 mg total) by mouth 2 (two) times daily as needed for up to 10 days for cough or to loosen phlegm.  Dispense: 20  tablet; Refill: 0  2. Nausea without vomiting - ondansetron (ZOFRAN ODT) 4 MG disintegrating tablet; Take 1 tablet (4 mg total) by mouth every 8 (eight) hours as needed for nausea or vomiting.  Dispense: 10 tablet; Refill: 0   Follow Up Instructions:    I discussed the assessment and treatment plan with the patient. The patient was provided an opportunity to ask questions and all were answered. The patient agreed with the plan and demonstrated an understanding of the instructions.   The patient was advised to call back or seek an in-person evaluation if the symptoms worsen or if the condition fails to improve as anticipated.  I  provided 21 minutes of non-face-to-face time during this encounter.        No orders of the defined types were placed in this encounter.    Loraine Grip Mayers, PA-C

## 2020-03-24 NOTE — Patient Instructions (Addendum)
Your Covid test was negative.  I encourage you to continue getting lots of hydration, lots of rest, I sent azithromycin to your pharmacy, you will take 2 tablets today and 1 tablet the rest of the days until it is finished.  This is an antibiotic which should help you start to feel better.  Make sure you take it all the way through.  I also sent Mucinex which will help you with your cough and congestion, and Zofran to help you with your nausea.  I hope that you feel better soon, please let us know if there is anything else we can do for you  Kennieth Rad, PA-C Physician Assistant [facility] [email or web address]    Upper Respiratory Infection, Adult An upper respiratory infection (URI) affects the nose, throat, and upper air passages. URIs are caused by germs (viruses). The most common type of URI is often called "the common cold." Medicines cannot cure URIs, but you can do things at home to relieve your symptoms. URIs usually get better within 7-10 days. Follow these instructions at home: Activity  Rest as needed.  If you have a fever, stay home from work or school until your fever is gone, or until your doctor says you may return to work or school. ? You should stay home until you cannot spread the infection anymore (you are not contagious). ? Your doctor may have you wear a face mask so you have less risk of spreading the infection. Relieving symptoms  Gargle with a salt-water mixture 3-4 times a day or as needed. To make a salt-water mixture, completely dissolve -1 tsp of salt in 1 cup of warm water.  Use a cool-mist humidifier to add moisture to the air. This can help you breathe more easily. Eating and drinking   Drink enough fluid to keep your pee (urine) pale yellow.  Eat soups and other clear broths. General instructions   Take over-the-counter and prescription medicines only as told by your doctor. These include  cold medicines, fever reducers, and cough suppressants.  Do not use any products that contain nicotine or tobacco. These include cigarettes and e-cigarettes. If you need help quitting, ask your doctor.  Avoid being where people are smoking (avoid secondhand smoke).  Make sure you get regular shots and get the flu shot every year.  Keep all follow-up visits as told by your doctor. This is important. How to avoid spreading infection to others   Wash your hands often with soap and water. If you do not have soap and water, use hand sanitizer.  Avoid touching your mouth, face, eyes, or nose.  Cough or sneeze into a tissue or your sleeve or elbow. Do not cough or sneeze into your hand or into the air. Contact a doctor if:  You are getting worse, not better.  You have any of these: ? A fever. ? Chills. ? Brown or red mucus in your nose. ? Yellow or brown fluid (discharge)coming from your nose. ? Pain in your face, especially when you bend forward. ? Swollen neck glands. ? Pain with swallowing. ? White areas in the back of your throat. Get help right away if:  You have shortness of breath that gets worse.  You have very bad or constant: ? Headache. ? Ear pain. ? Pain in your forehead, behind your eyes, and over your cheekbones (sinus pain). ? Chest pain.  You have long-lasting (chronic) lung disease along with any of these: ? Wheezing. ? Long-lasting cough. ?  Coughing up blood. ? A change in your usual mucus.  You have a stiff neck.  You have changes in your: ? Vision. ? Hearing. ? Thinking. ? Mood. Summary  An upper respiratory infection (URI) is caused by a germ called a virus. The most common type of URI is often called "the common cold."  URIs usually get better within 7-10 days.  Take over-the-counter and prescription medicines only as told by your doctor. This information is not intended to replace advice given to you by your health care provider. Make sure  you discuss any questions you have with your health care provider. Document Revised: 03/20/2018 Document Reviewed: 11/02/2016 Elsevier Patient Education  2020 [employer].

## 2020-05-15 ENCOUNTER — Emergency Department (HOSPITAL_BASED_OUTPATIENT_CLINIC_OR_DEPARTMENT_OTHER): Payer: BC Managed Care – PPO

## 2020-05-15 ENCOUNTER — Other Ambulatory Visit: Payer: Self-pay

## 2020-05-15 ENCOUNTER — Encounter (HOSPITAL_BASED_OUTPATIENT_CLINIC_OR_DEPARTMENT_OTHER): Payer: Self-pay | Admitting: Emergency Medicine

## 2020-05-15 ENCOUNTER — Emergency Department (HOSPITAL_BASED_OUTPATIENT_CLINIC_OR_DEPARTMENT_OTHER)
Admission: EM | Admit: 2020-05-15 | Discharge: 2020-05-15 | Disposition: A | Discharge status: Home / Self Care | Payer: BC Managed Care – PPO | Attending: Emergency Medicine | Admitting: Emergency Medicine

## 2020-05-15 DIAGNOSIS — Z87891 Personal history of nicotine dependence: Secondary | ICD-10-CM | POA: Diagnosis not present

## 2020-05-15 DIAGNOSIS — S8262XA Displaced fracture of lateral malleolus of left fibula, initial encounter for closed fracture: Secondary | ICD-10-CM | POA: Diagnosis not present

## 2020-05-15 DIAGNOSIS — S99922A Unspecified injury of left foot, initial encounter: Secondary | ICD-10-CM | POA: Diagnosis present

## 2020-05-15 DIAGNOSIS — X501XXA Overexertion from prolonged static or awkward postures, initial encounter: Secondary | ICD-10-CM | POA: Diagnosis not present

## 2020-05-15 DIAGNOSIS — S92152A Displaced avulsion fracture (chip fracture) of left talus, initial encounter for closed fracture: Secondary | ICD-10-CM | POA: Insufficient documentation

## 2020-05-15 DIAGNOSIS — W19XXXA Unspecified fall, initial encounter: Secondary | ICD-10-CM

## 2020-05-15 NOTE — Discharge Instructions (Addendum)
You were seen in the emergency department for evaluation of left foot and ankle pain after a fall.  Your x-ray showed small avulsion fractures off of the lateral side of your ankle and the top of your foot.  These are being treated with immobilization.  Please contact your orthopedic doctor for close follow-up.  Ice to the affected area.  Tylenol or ibuprofen as needed for pain.

## 2020-05-15 NOTE — ED Provider Notes (Signed)
[facility] [facility] EMERGENCY DEPARTMENT Provider Note   CSN: [id number] Arrival date & time: 05/15/20  1242     History Chief Complaint  Patient presents with  . Ankle Pain  . Foot Pain    Jasmine Weber is a 50 y.o. female.  She was wearing high heels and twisted her left foot and ankle falling to the ground today at church.  Complaining of pain mostly across to her midfoot.  No numbness.  Pain is worse with any type of weightbearing or palpation.  Denies other injury.  The history is provided by the patient.  Ankle Pain Location:  Ankle and foot Injury: yes   Mechanism of injury: fall   Fall:    Fall occurred:  Standing Ankle location:  L ankle Foot location:  L foot and dorsum of L foot Pain details:    Quality:  Sharp   Severity:  Moderate   Onset quality:  Sudden   Timing:  Constant   Progression:  Unchanged Chronicity:  New Foreign body present:  No foreign bodies Relieved by:  Nothing Worsened by:  Bearing weight Ineffective treatments:  None tried Associated symptoms: no back pain, no fever, no neck pain, no numbness and no tingling   Foot Pain       Past Medical History:  Diagnosis Date  . Anxiety   . Fibroids   . Hypercholesteremia     Patient Active Problem List   Diagnosis Date Noted  . Acute upper respiratory infection 03/24/2020  . Traumatic hematoma of left wrist 07/04/2015  . Abnormal Pap smear of cervix 04/05/2014  . Obesity 04/05/2014    Past Surgical History:  Procedure Laterality Date  . TUBAL LIGATION    . WRIST SURGERY Right    tendonitis     OB History    Gravida  5   Para  3   Term  3   Preterm      AB  2   Living  3     SAB      IAB  2   Ectopic      Multiple      Live Births              Family History  Problem Relation Age of Onset  . Heart failure Mother   . Hypertension Mother   . Cancer Father        Prostate  . Diabetes Maternal Grandmother   . Cancer Maternal Grandfather      Social History   Tobacco Use  . Smoking status: Former Research scientist (life sciences)  . Smokeless tobacco: Never Used  Vaping Use  . Vaping Use: Every day  Substance Use Topics  . Alcohol use: Yes    Comment: rarely  . Drug use: Yes    Types: Marijuana    Home Medications Prior to Admission medications   Medication Sig Start Date End Date Taking? Authorizing Provider  azithromycin (ZITHROMAX) 250 MG tablet Take 2 tabs po day 1, then take 1 tab po daily 03/24/20   Mayers, Cari S, PA-C  ibuprofen (ADVIL) 600 MG tablet Take 1 tablet (600 mg total) by mouth every 8 (eight) hours as needed. Take with food. 10/30/19   Lajean Saver, MD  methocarbamol (ROBAXIN) 750 MG tablet Take 1 tablet (750 mg total) by mouth 3 (three) times daily as needed (muscle spasm/pain). 10/30/19   Lajean Saver, MD  ondansetron (ZOFRAN ODT) 4 MG disintegrating tablet Take 1 tablet (4 mg total) by mouth every  8 (eight) hours as needed for nausea or vomiting. 03/24/20   Mayers, Cari S, PA-C  dicyclomine (BENTYL) 20 MG tablet Take 1 tablet (20 mg total) by mouth 2 (two) times daily. 07/01/18 10/30/19  Henderly, Britni A, PA-C  DULoxetine (CYMBALTA) 20 MG capsule Take 20 mg by mouth 2 (two) times daily.  10/30/19  [provider]  zolpidem (AMBIEN) 10 MG tablet Take 10 mg by mouth at bedtime as needed for sleep.  10/30/19  [provider]    Allergies    Patient has no known allergies.  Review of Systems   Review of Systems  Constitutional: Negative for fever.  Musculoskeletal: Negative for back pain and neck pain.  Skin: Negative for wound.    Physical Exam Updated Vital Signs BP (!) 146/106 (BP Location: Left Arm)   Pulse 86   Temp 98.9 F (37.2 C) (Oral)   Resp 20   Ht 5\' 7"  (1.702 m)   Wt 90.7 kg   LMP 04/27/2020   SpO2 100%   BMI 31.32 kg/m   Physical Exam Constitutional:      Appearance: She is well-developed and well-nourished.  HENT:     Head: Normocephalic and atraumatic.  Eyes:      Conjunctiva/sclera: Conjunctivae normal.  Musculoskeletal:        General: Tenderness and signs of injury present. No deformity. Normal range of motion.     Cervical back: Neck supple.     Comments: Ankle nontender bimalleolar.  Does have calcaneal pain along with midfoot pain of the left foot.  Distal cap refill sensory and motor intact.  DP and PT pulses intact no open wounds.  Skin:    General: Skin is warm and dry.  Neurological:     Mental Status: She is alert.     GCS: GCS eye subscore is 4. GCS verbal subscore is 5. GCS motor subscore is 6.  Psychiatric:        Mood and Affect: Mood and affect normal.     ED Results / Procedures / Treatments   Labs (all labs ordered are listed, but only abnormal results are displayed) Labs Reviewed - No data to display  EKG None  Radiology DG Ankle Complete Left  Result Date: 05/15/2020 CLINICAL DATA:  Fall.  Injury with pain. EXAM: LEFT ANKLE COMPLETE - 3+ VIEW COMPARISON:  None. FINDINGS: Tiny avulsion fragment noted from the lateral malleolus. No other acute fracture or dislocation evident. No worrisome lytic or sclerotic osseous abnormality. IMPRESSION: Tiny avulsion fragment from the lateral malleolus. Electronically Signed   By: Misty Stanley M.D.   On: 05/15/2020 13:39   DG Foot Complete Left  Result Date: 05/15/2020 CLINICAL DATA:  Foot injury.  Pain. EXAM: LEFT FOOT - COMPLETE 3+ VIEW COMPARISON:  None. FINDINGS: No gross fracture or dislocation. Tiny cortical avulsion fragments are identified along the dorsal aspect of the talus and navicular, visualized on the lateral projection. No worrisome lytic or sclerotic osseous abnormality. IMPRESSION: Tiny cortical avulsion fragments from the dorsal aspect of the talus and navicular seen only on the lateral projection. Electronically Signed   By: Misty Stanley M.D.   On: 05/15/2020 13:38    Procedures Procedures   Medications Ordered in ED Medications - No data to display  ED Course   I have reviewed the triage vital signs and the nursing notes.  Pertinent labs & imaging results that were available during my care of the patient were reviewed by me and considered in my  medical decision making (see chart for details).  Clinical Course as of 05/15/20 1712  Sun May 15, 2020  1333 X-rays of foot and ankle interpreted by me as no acute fracture.  Awaiting radiology reading. [MB]    Clinical Course User Index [MB] Hayden Rasmussen, MD   MDM Rules/Calculators/A&P                         Differential diagnosis includes fracture, dislocation, sprain.  Patient placed in cam boot and given crutches.  She has an orthopedist already and recommended that she follow-up with them.  Return instructions discussed  Final Clinical Impression(s) / ED Diagnoses Final diagnoses:  Fall  Closed avulsion fracture of lateral malleolus of left fibula, initial encounter  Closed displaced avulsion fracture of left talus, initial encounter    Rx / DC Orders ED Discharge Orders    None       Hayden Rasmussen, MD 05/15/20 [phone]

## 2020-05-15 NOTE — ED Notes (Signed)
AVS reviewed with pt and husband, CAM boot applied by NT, crutches also provided, with crutch teaching also done by RN and NT. Discussed pain control by use of Tylenol and Ibuprofen, elevation and ice pack use. Opportunity for questions provided.

## 2020-05-15 NOTE — ED Triage Notes (Signed)
Pt was at church today when she was twisted her left foot and ankle. Pt went to turn but her foot went in a different direction.

## 2020-07-27 ENCOUNTER — Emergency Department (HOSPITAL_BASED_OUTPATIENT_CLINIC_OR_DEPARTMENT_OTHER): Payer: BC Managed Care – PPO

## 2020-07-27 ENCOUNTER — Emergency Department (HOSPITAL_BASED_OUTPATIENT_CLINIC_OR_DEPARTMENT_OTHER)
Admission: EM | Admit: 2020-07-27 | Discharge: 2020-07-27 | Disposition: A | Discharge status: Home / Self Care | Payer: BC Managed Care – PPO | Attending: Emergency Medicine | Admitting: Emergency Medicine

## 2020-07-27 ENCOUNTER — Encounter (HOSPITAL_BASED_OUTPATIENT_CLINIC_OR_DEPARTMENT_OTHER): Payer: Self-pay

## 2020-07-27 ENCOUNTER — Other Ambulatory Visit: Payer: Self-pay

## 2020-07-27 DIAGNOSIS — Z87891 Personal history of nicotine dependence: Secondary | ICD-10-CM | POA: Diagnosis not present

## 2020-07-27 DIAGNOSIS — U071 COVID-19: Secondary | ICD-10-CM | POA: Insufficient documentation

## 2020-07-27 DIAGNOSIS — R0602 Shortness of breath: Secondary | ICD-10-CM | POA: Diagnosis present

## 2020-07-27 LAB — BASIC METABOLIC PANEL
Anion gap: 9 (ref 5–15)
BUN: 8 mg/dL (ref 6–20)
CO2: 25 mmol/L (ref 22–32)
Calcium: 8.7 mg/dL — ABNORMAL LOW (ref 8.9–10.3)
Chloride: 103 mmol/L (ref 98–111)
Creatinine, Ser: 0.74 mg/dL (ref 0.44–1.00)
GFR, Estimated: >60 mL/min (ref >60)
Glucose, Bld: 105 mg/dL — ABNORMAL HIGH (ref 70–99)
Potassium: 3.4 mmol/L — ABNORMAL LOW (ref 3.5–5.1)
Sodium: 137 mmol/L (ref 135–145)

## 2020-07-27 LAB — CBC
HCT: 38.4 % (ref 36.0–46.0)
Hemoglobin: 12.7 g/dL (ref 12.0–15.0)
MCH: 26 pg (ref 26.0–34.0)
MCHC: 33.1 g/dL (ref 30.0–36.0)
MCV: 78.5 fL — ABNORMAL LOW (ref 80.0–100.0)
Platelets: 325 10*3/uL (ref 150–400)
RBC: 4.89 MIL/uL (ref 3.87–5.11)
RDW: 14.6 % (ref 11.5–15.5)
WBC: 6.7 10*3/uL (ref 4.0–10.5)
nRBC: 0 % (ref 0.0–0.2)

## 2020-07-27 LAB — URINALYSIS, ROUTINE W REFLEX MICROSCOPIC
Bilirubin Urine: NEGATIVE
Glucose, UA: NEGATIVE mg/dL
Hgb urine dipstick: NEGATIVE
Ketones, ur: NEGATIVE mg/dL
Leukocytes,Ua: NEGATIVE
Nitrite: NEGATIVE
Protein, ur: NEGATIVE mg/dL
Specific Gravity, Urine: 1.015 (ref 1.005–1.030)
pH: 8.5 — ABNORMAL HIGH (ref 5.0–8.0)

## 2020-07-27 LAB — PREGNANCY, URINE: Preg Test, Ur: NEGATIVE

## 2020-07-27 LAB — MAGNESIUM: Magnesium: 1.9 mg/dL (ref 1.7–2.4)

## 2020-07-27 MED ORDER — POTASSIUM CHLORIDE 10 MEQ/100ML IV SOLN
10.0000 meq | Freq: Once | INTRAVENOUS | Status: AC
  Administered 2020-07-27: 10 meq via INTRAVENOUS
  Filled 2020-07-27: qty 100

## 2020-07-27 MED ORDER — MAGNESIUM SULFATE 50 % IJ SOLN: 1.0000 g | Freq: Once | INTRAMUSCULAR | Status: DC

## 2020-07-27 MED ORDER — SODIUM CHLORIDE 0.9 % IV BOLUS
1000.0000 mL | Freq: Once | INTRAVENOUS | Status: AC
  Administered 2020-07-27: 1000 mL via INTRAVENOUS

## 2020-07-27 MED ORDER — ACETAMINOPHEN 325 MG PO TABS
650.0000 mg | ORAL_TABLET | Freq: Once | ORAL | Status: AC
  Administered 2020-07-27: 650 mg via ORAL
  Filled 2020-07-27: qty 2

## 2020-07-27 MED ORDER — POTASSIUM CHLORIDE CRYS ER 20 MEQ PO TBCR
40.0000 meq | EXTENDED_RELEASE_TABLET | Freq: Once | ORAL | Status: AC
  Administered 2020-07-27: 40 meq via ORAL
  Filled 2020-07-27: qty 2

## 2020-07-27 MED ORDER — MAGNESIUM SULFATE IN D5W 1-5 GM/100ML-% IV SOLN
1.0000 g | Freq: Once | INTRAVENOUS | Status: AC
  Administered 2020-07-27: 1 g via INTRAVENOUS
  Filled 2020-07-27: qty 100

## 2020-07-27 NOTE — ED Provider Notes (Signed)
[facility] EMERGENCY DEPARTMENT Provider Note   CSN: [id number] Arrival date & time: 07/27/20  1222     History Chief Complaint  Patient presents with  . Covid Positive  . Shortness of Breath    Jasmine Weber is a 50 y.o. female with no pertinent past medical history the presents the emerge department today for COVID symptoms.  Patient states that she started having COVID symptoms over a week ago, tested positive yesterday.  Patient states that husband is home with the same illness.  States that her primary complaints are shortness of breath and diarrhea.  States that she has been able to eat and drink normally, no fevers.  No nausea or vomiting.  Patient states that shortness of breath is worse when she coughs, denies any cardiac or lung issues.  States that she is generally healthy.  Has been vaccinated and boosted against COVID.  Denies any hemoptysis.  No medical issues.  Denies any chest pain.  Denies any urinary complaints.  HPI     Past Medical History:  Diagnosis Date  . Anxiety   . Fibroids   . Hypercholesteremia     Patient Active Problem List   Diagnosis Date Noted  . Acute upper respiratory infection 03/24/2020  . Traumatic hematoma of left wrist 07/04/2015  . Abnormal Pap smear of cervix 04/05/2014  . Obesity 04/05/2014    Past Surgical History:  Procedure Laterality Date  . TUBAL LIGATION    . WRIST SURGERY Right    tendonitis     OB History    Gravida  5   Para  3   Term  3   Preterm      AB  2   Living  3     SAB      IAB  2   Ectopic      Multiple      Live Births              Family History  Problem Relation Age of Onset  . Heart failure Mother   . Hypertension Mother   . Cancer Father        Prostate  . Diabetes Maternal Grandmother   . Cancer Maternal Grandfather     Social History   Tobacco Use  . Smoking status: Former Research scientist (life sciences)  . Smokeless tobacco: Never Used  Vaping Use  . Vaping Use: Every day   Substance Use Topics  . Alcohol use: Yes    Comment: rarely  . Drug use: Yes    Types: Marijuana    Home Medications Prior to Admission medications   Medication Sig Start Date End Date Taking? Authorizing Provider  azithromycin (ZITHROMAX) 250 MG tablet Take 2 tabs po day 1, then take 1 tab po daily 03/24/20   Mayers, Cari S, PA-C  ibuprofen (ADVIL) 600 MG tablet Take 1 tablet (600 mg total) by mouth every 8 (eight) hours as needed. Take with food. 10/30/19   Lajean Saver, MD  methocarbamol (ROBAXIN) 750 MG tablet Take 1 tablet (750 mg total) by mouth 3 (three) times daily as needed (muscle spasm/pain). 10/30/19   Lajean Saver, MD  ondansetron (ZOFRAN ODT) 4 MG disintegrating tablet Take 1 tablet (4 mg total) by mouth every 8 (eight) hours as needed for nausea or vomiting. 03/24/20   Mayers, Cari S, PA-C  dicyclomine (BENTYL) 20 MG tablet Take 1 tablet (20 mg total) by mouth 2 (two) times daily. 07/01/18 10/30/19  Henderly, Britni A, PA-C  DULoxetine (  CYMBALTA) 20 MG capsule Take 20 mg by mouth 2 (two) times daily.  10/30/19  [provider]  zolpidem (AMBIEN) 10 MG tablet Take 10 mg by mouth at bedtime as needed for sleep.  10/30/19  [provider]    Allergies    Patient has no known allergies.  Review of Systems   Review of Systems  Constitutional: Negative for chills, diaphoresis, fatigue and fever.  HENT: Negative for congestion, sore throat and trouble swallowing.   Eyes: Negative for pain and visual disturbance.  Respiratory: Positive for cough and shortness of breath. Negative for wheezing.   Cardiovascular: Negative for chest pain, palpitations and leg swelling.  Gastrointestinal: Positive for diarrhea. Negative for abdominal distention, abdominal pain, nausea and vomiting.  Genitourinary: Negative for difficulty urinating.  Musculoskeletal: Negative for back pain, neck pain and neck stiffness.  Skin: Negative for pallor.  Neurological: Positive for headaches.  Negative for dizziness, speech difficulty and weakness.  Psychiatric/Behavioral: Negative for confusion.    Physical Exam Updated Vital Signs BP (!) 143/83   Pulse 61   Temp 98.4 F (36.9 C) (Oral)   Resp 18   Ht 5\' 7"  (1.702 m)   Wt 86.1 kg   LMP 07/20/2020   SpO2 100%   BMI 29.74 kg/m   Physical Exam Constitutional:      General: She is not in acute distress.    Appearance: Normal appearance. She is not ill-appearing, toxic-appearing or diaphoretic.  HENT:     Mouth/Throat:     Mouth: Mucous membranes are moist.     Pharynx: Oropharynx is clear.  Eyes:     General: No scleral icterus.    Extraocular Movements: Extraocular movements intact.     Pupils: Pupils are equal, round, and reactive to light.  Cardiovascular:     Rate and Rhythm: Normal rate and regular rhythm.     Pulses: Normal pulses.     Heart sounds: Normal heart sounds.  Pulmonary:     Effort: Pulmonary effort is normal. No respiratory distress.     Breath sounds: Normal breath sounds. No stridor. No wheezing, rhonchi or rales.  Chest:     Chest wall: No tenderness.  Abdominal:     General: Abdomen is flat. There is no distension.     Palpations: Abdomen is soft.     Tenderness: There is no abdominal tenderness. There is no guarding or rebound.  Musculoskeletal:        General: No swelling or tenderness. Normal range of motion.     Cervical back: Normal range of motion and neck supple. No rigidity.     Right lower leg: No edema.     Left lower leg: No edema.  Skin:    General: Skin is warm and dry.     Capillary Refill: Capillary refill takes less than 2 seconds.     Coloration: Skin is not pale.  Neurological:     General: No focal deficit present.     Mental Status: She is alert and oriented to person, place, and time.  Psychiatric:        Mood and Affect: Mood normal.        Behavior: Behavior normal.     ED Results / Procedures / Treatments   Labs (all labs ordered are listed, but only  abnormal results are displayed) Labs Reviewed  BASIC METABOLIC PANEL - Abnormal; Notable for the following components:      Result Value   Potassium 3.4 (*)  Glucose, Bld 105 (*)    Calcium 8.7 (*)    All other components within normal limits  CBC - Abnormal; Notable for the following components:   MCV 78.5 (*)    All other components within normal limits  URINALYSIS, ROUTINE W REFLEX MICROSCOPIC - Abnormal; Notable for the following components:   pH 8.5 (*)    All other components within normal limits  PREGNANCY, URINE  MAGNESIUM    EKG EKG Interpretation  Date/Time:  Wednesday Jul 27 2020 12:36:23 EDT Ventricular Rate:  71 PR Interval:  146 QRS Duration: 107 QT Interval:  553 QTC Calculation: 602 R Axis:   18 Text Interpretation: Sinus rhythm Low voltage, precordial leads Borderline T abnormalities, anterior leads Prolonged QT interval Confirmed by Lennice Sites ([id number]) on 07/27/2020 3:02:21 PM   Radiology DG Chest Portable 1 View  Result Date: 07/27/2020 CLINICAL DATA:  Chills and shortness of breath.  COVID-19 positive. EXAM: PORTABLE CHEST 1 VIEW COMPARISON:  Chest x-ray dated November 27, 2019. FINDINGS: The heart size and mediastinal contours are within normal limits. Both lungs are clear. The visualized skeletal structures are unremarkable. IMPRESSION: No active disease. Electronically Signed   By: Titus Dubin M.D.   On: 07/27/2020 13:45    Procedures Procedures   Medications Ordered in ED Medications  magnesium sulfate IVPB 1 g 100 mL (1 g Intravenous New Bag/Given 07/27/20 1620)  acetaminophen (TYLENOL) tablet 650 mg (650 mg Oral Given 07/27/20 1316)  sodium chloride 0.9 % bolus 1,000 mL (0 mLs Intravenous Stopped 07/27/20 1617)  potassium chloride 10 mEq in 100 mL IVPB (0 mEq Intravenous Stopped 07/27/20 1618)  potassium chloride SA (KLOR-CON) CR tablet 40 mEq (40 mEq Oral Given 07/27/20 1512)    ED Course  I have reviewed the triage vital signs and the nursing  notes.  Pertinent labs & imaging results that were available during my care of the patient were reviewed by me and considered in my medical decision making (see chart for details).    MDM Rules/Calculators/A&P                         50 year old female, low risk presents for COVID-like symptoms, tested positive for COVID yesterday.  Complaining of shortness of breath, chest x-ray interpreted by me without any acute cardiopulmonary disease.  No evidence of pneumonia.  Work-up today with no major electrolyte derangements, however QTC is prolonged at 600.  Will give fluids, potassium and magnesium at this time and repeat EKG.  Spoke to Dr. Ronnald Nian about this.  Repeat EKG QTC 466.  Patient is low risk, over 5 days, no need for immunotherapy or viral therapy at this time.  Appears well, satting at 100% on room air, no respiratory distress.  Upon ambulation patient remained above 98% on room air.  Patient be discharged at this time.  Symptomatic treatment discussed.   Doubt need for further emergent work up at this time. I explained the diagnosis and have given explicit precautions to return to the ER including for any other new or worsening symptoms. The patient understands and accepts the medical plan as it's been dictated and I have answered their questions. Discharge instructions concerning home care and prescriptions have been given. The patient is STABLE and is discharged to home in good condition.  I discussed this case with my attending physician, Dr. Chip Boer, who cosigned this note including patient's presenting symptoms, physical exam, and planned diagnostics and interventions. Attending physician stated agreement  with plan or made changes to plan which were implemented.    Final Clinical Impression(s) / ED Diagnoses Final diagnoses:  COVID    Rx / DC Orders ED Discharge Orders    None       Alfredia Client, PA-C 07/27/20 [place], [place], DO 07/27/20 1837

## 2020-07-27 NOTE — ED Triage Notes (Signed)
Pt arrives with reports of being Covid positive with symptoms starting Thursday states that she tested positive last night and has had increased SOB, chills, headaches, and diarrhea.

## 2020-07-27 NOTE — Discharge Instructions (Signed)
  You were evaluated in the Emergency Department and after careful evaluation, we did not find any emergent condition requiring admission or further testing in the hospital.   Your exam/testing today was overall reassuring.  Symptoms seem to be due to COVID.  Please follow-up with primary care doctor, you can take Tylenol or ibuprofen as directed on the bottle for pain, please stay hydrated.  If you have any new or worsening concerning symptoms please come back to the emergency department. Please return to the Emergency Department if you experience any worsening of your condition.  Thank you for allowing [place] to be a part of your care. Please speak to your pharmacist about any new medications prescribed today in regards to side effects or interactions with other medications.

## 2020-07-27 NOTE — ED Notes (Signed)
Pt ambulatory in room, SpO2 maintained between 96-100% on RA

## 2020-07-27 NOTE — ED Notes (Signed)
Pt on monitor and vitals cycling 

## 2020-10-14 ENCOUNTER — Other Ambulatory Visit: Payer: Self-pay

## 2020-10-14 ENCOUNTER — Emergency Department (HOSPITAL_BASED_OUTPATIENT_CLINIC_OR_DEPARTMENT_OTHER)
Admission: EM | Admit: 2020-10-14 | Discharge: 2020-10-14 | Disposition: A | Discharge status: Home / Self Care | Payer: BC Managed Care – PPO | Attending: Emergency Medicine | Admitting: Emergency Medicine

## 2020-10-14 ENCOUNTER — Encounter (HOSPITAL_BASED_OUTPATIENT_CLINIC_OR_DEPARTMENT_OTHER): Payer: Self-pay | Admitting: Emergency Medicine

## 2020-10-14 DIAGNOSIS — Z20822 Contact with and (suspected) exposure to covid-19: Secondary | ICD-10-CM | POA: Insufficient documentation

## 2020-10-14 DIAGNOSIS — R1110 Vomiting, unspecified: Secondary | ICD-10-CM | POA: Insufficient documentation

## 2020-10-14 DIAGNOSIS — J029 Acute pharyngitis, unspecified: Secondary | ICD-10-CM | POA: Insufficient documentation

## 2020-10-14 DIAGNOSIS — Z5321 Procedure and treatment not carried out due to patient leaving prior to being seen by health care provider: Secondary | ICD-10-CM | POA: Diagnosis not present

## 2020-10-14 DIAGNOSIS — R5383 Other fatigue: Secondary | ICD-10-CM | POA: Insufficient documentation

## 2020-10-14 NOTE — ED Triage Notes (Signed)
Pt reports emesis, sore throat, fatigue x3 days. Pt reports was exposed to covid at work.

## 2021-01-24 ENCOUNTER — Encounter (HOSPITAL_BASED_OUTPATIENT_CLINIC_OR_DEPARTMENT_OTHER): Payer: Self-pay | Admitting: *Deleted

## 2021-01-24 ENCOUNTER — Other Ambulatory Visit: Payer: Self-pay

## 2021-01-24 ENCOUNTER — Emergency Department (HOSPITAL_BASED_OUTPATIENT_CLINIC_OR_DEPARTMENT_OTHER)
Admission: EM | Admit: 2021-01-24 | Discharge: 2021-01-25 | Disposition: A | Discharge status: Home / Self Care | Payer: BC Managed Care – PPO | Attending: Emergency Medicine | Admitting: Emergency Medicine

## 2021-01-24 DIAGNOSIS — R42 Dizziness and giddiness: Secondary | ICD-10-CM | POA: Insufficient documentation

## 2021-01-24 DIAGNOSIS — Z87891 Personal history of nicotine dependence: Secondary | ICD-10-CM | POA: Insufficient documentation

## 2021-01-24 DIAGNOSIS — R519 Headache, unspecified: Secondary | ICD-10-CM | POA: Diagnosis not present

## 2021-01-24 DIAGNOSIS — I10 Essential (primary) hypertension: Secondary | ICD-10-CM | POA: Insufficient documentation

## 2021-01-24 DIAGNOSIS — R200 Anesthesia of skin: Secondary | ICD-10-CM | POA: Diagnosis not present

## 2021-01-24 DIAGNOSIS — R030 Elevated blood-pressure reading, without diagnosis of hypertension: Secondary | ICD-10-CM

## 2021-01-24 LAB — COMPREHENSIVE METABOLIC PANEL
ALT: 10 U/L (ref 0–44)
AST: 15 U/L (ref 15–41)
Albumin: 3.7 g/dL (ref 3.5–5.0)
Alkaline Phosphatase: 77 U/L (ref 38–126)
Anion gap: 9 (ref 5–15)
BUN: 11 mg/dL (ref 6–20)
CO2: 25 mmol/L (ref 22–32)
Calcium: 8.8 mg/dL — ABNORMAL LOW (ref 8.9–10.3)
Chloride: 102 mmol/L (ref 98–111)
Creatinine, Ser: 0.57 mg/dL (ref 0.44–1.00)
GFR, Estimated: >60 mL/min (ref >60)
Glucose, Bld: 100 mg/dL — ABNORMAL HIGH (ref 70–99)
Potassium: 3.6 mmol/L (ref 3.5–5.1)
Sodium: 136 mmol/L (ref 135–145)
Total Bilirubin: 0.4 mg/dL (ref 0.3–1.2)
Total Protein: 7.3 g/dL (ref 6.5–8.1)

## 2021-01-24 LAB — URINALYSIS, ROUTINE W REFLEX MICROSCOPIC
Bilirubin Urine: NEGATIVE
Glucose, UA: NEGATIVE mg/dL
Ketones, ur: NEGATIVE mg/dL
Leukocytes,Ua: NEGATIVE
Nitrite: NEGATIVE
Protein, ur: NEGATIVE mg/dL
Specific Gravity, Urine: 1.01 (ref 1.005–1.030)
pH: 7 (ref 5.0–8.0)

## 2021-01-24 LAB — CBC WITH DIFFERENTIAL/PLATELET
Abs Immature Granulocytes: 0.03 10*3/uL (ref 0.00–0.07)
Basophils Absolute: 0.1 10*3/uL (ref 0.0–0.1)
Basophils Relative: 1 %
Eosinophils Absolute: 0.2 10*3/uL (ref 0.0–0.5)
Eosinophils Relative: 2 %
HCT: 39.6 % (ref 36.0–46.0)
Hemoglobin: 13.2 g/dL (ref 12.0–15.0)
Immature Granulocytes: 0 %
Lymphocytes Relative: 25 %
Lymphs Abs: 2.4 10*3/uL (ref 0.7–4.0)
MCH: 26.5 pg (ref 26.0–34.0)
MCHC: 33.3 g/dL (ref 30.0–36.0)
MCV: 79.5 fL — ABNORMAL LOW (ref 80.0–100.0)
Monocytes Absolute: 0.7 10*3/uL (ref 0.1–1.0)
Monocytes Relative: 7 %
Neutro Abs: 6.3 10*3/uL (ref 1.7–7.7)
Neutrophils Relative %: 65 %
Platelets: 295 10*3/uL (ref 150–400)
RBC: 4.98 MIL/uL (ref 3.87–5.11)
RDW: 15.3 % (ref 11.5–15.5)
WBC: 9.6 10*3/uL (ref 4.0–10.5)
nRBC: 0 % (ref 0.0–0.2)

## 2021-01-24 LAB — URINALYSIS, MICROSCOPIC (REFLEX)

## 2021-01-24 LAB — PREGNANCY, URINE: Preg Test, Ur: NEGATIVE

## 2021-01-24 MED ORDER — KETOROLAC TROMETHAMINE 15 MG/ML IJ SOLN
15.0000 mg | Freq: Once | INTRAMUSCULAR | Status: AC
  Administered 2021-01-24: 15 mg via INTRAVENOUS
  Filled 2021-01-24: qty 1

## 2021-01-24 MED ORDER — SODIUM CHLORIDE 0.9 % IV BOLUS
1000.0000 mL | Freq: Once | INTRAVENOUS | Status: AC
  Administered 2021-01-24: 1000 mL via INTRAVENOUS

## 2021-01-24 MED ORDER — DEXAMETHASONE SODIUM PHOSPHATE 10 MG/ML IJ SOLN
10.0000 mg | Freq: Once | INTRAMUSCULAR | Status: AC
  Administered 2021-01-24: 10 mg via INTRAVENOUS
  Filled 2021-01-24: qty 1

## 2021-01-24 NOTE — ED Triage Notes (Signed)
States since yesterday she has been nauseated and faint feeling on and off. She took her BP and it was elevated. Denies pain. EKG at triage.
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
## 2021-01-25 NOTE — Discharge Instructions (Signed)
Take Tylenol and/or ibuprofen as needed for headache. Call your primary care doctor tomorrow to set up a follow-up appointment. Call the neurology office listed below to set up a follow-up appointment. Return to the emergency room immediately if you develop one-sided weakness, numbness, or asymmetry.  Return with any new, worsening, concerning symptoms

## 2021-01-25 NOTE — ED Notes (Signed)
Discharge instructions discussed with pt. Pt verbalized understanding. Pt stable and ambulatory.  °

## 2021-04-24 ENCOUNTER — Emergency Department (HOSPITAL_BASED_OUTPATIENT_CLINIC_OR_DEPARTMENT_OTHER)
Admission: EM | Admit: 2021-04-24 | Discharge: 2021-04-24 | Disposition: A | Discharge status: Home / Self Care | Payer: Managed Care, Other (non HMO) | Attending: Emergency Medicine | Admitting: Emergency Medicine

## 2021-04-24 ENCOUNTER — Encounter (HOSPITAL_BASED_OUTPATIENT_CLINIC_OR_DEPARTMENT_OTHER): Payer: Self-pay

## 2021-04-24 ENCOUNTER — Emergency Department (HOSPITAL_BASED_OUTPATIENT_CLINIC_OR_DEPARTMENT_OTHER): Payer: Managed Care, Other (non HMO)

## 2021-04-24 ENCOUNTER — Other Ambulatory Visit: Payer: Self-pay

## 2021-04-24 DIAGNOSIS — R42 Dizziness and giddiness: Secondary | ICD-10-CM | POA: Diagnosis not present

## 2021-04-24 DIAGNOSIS — R079 Chest pain, unspecified: Secondary | ICD-10-CM | POA: Diagnosis present

## 2021-04-24 LAB — CBC WITH DIFFERENTIAL/PLATELET
Abs Immature Granulocytes: 0.03 10*3/uL (ref 0.00–0.07)
Basophils Absolute: 0.1 10*3/uL (ref 0.0–0.1)
Basophils Relative: 1 %
Eosinophils Absolute: 0.1 10*3/uL (ref 0.0–0.5)
Eosinophils Relative: 1 %
HCT: 37.2 % (ref 36.0–46.0)
Hemoglobin: 12.6 g/dL (ref 12.0–15.0)
Immature Granulocytes: 1 %
Lymphocytes Relative: 20 %
Lymphs Abs: 1.3 10*3/uL (ref 0.7–4.0)
MCH: 26.4 pg (ref 26.0–34.0)
MCHC: 33.9 g/dL (ref 30.0–36.0)
MCV: 78 fL — ABNORMAL LOW (ref 80.0–100.0)
Monocytes Absolute: 0.4 10*3/uL (ref 0.1–1.0)
Monocytes Relative: 6 %
Neutro Abs: 4.7 10*3/uL (ref 1.7–7.7)
Neutrophils Relative %: 71 %
Platelets: 313 10*3/uL (ref 150–400)
RBC: 4.77 MIL/uL (ref 3.87–5.11)
RDW: 15 % (ref 11.5–15.5)
WBC: 6.5 10*3/uL (ref 4.0–10.5)
nRBC: 0 % (ref 0.0–0.2)

## 2021-04-24 LAB — COMPREHENSIVE METABOLIC PANEL
ALT: 12 U/L (ref 0–44)
AST: 24 U/L (ref 15–41)
Albumin: 3.6 g/dL (ref 3.5–5.0)
Alkaline Phosphatase: 65 U/L (ref 38–126)
Anion gap: 7 (ref 5–15)
BUN: 9 mg/dL (ref 6–20)
CO2: 25 mmol/L (ref 22–32)
Calcium: 8.7 mg/dL — ABNORMAL LOW (ref 8.9–10.3)
Chloride: 103 mmol/L (ref 98–111)
Creatinine, Ser: 0.59 mg/dL (ref 0.44–1.00)
GFR, Estimated: >60 mL/min (ref >60)
Glucose, Bld: 124 mg/dL — ABNORMAL HIGH (ref 70–99)
Potassium: 3.6 mmol/L (ref 3.5–5.1)
Sodium: 135 mmol/L (ref 135–145)
Total Bilirubin: 0.9 mg/dL (ref 0.3–1.2)
Total Protein: 7 g/dL (ref 6.5–8.1)

## 2021-04-24 LAB — D-DIMER, QUANTITATIVE: D-Dimer, Quant: 0.4 ug/mL-FEU (ref 0.00–0.50)

## 2021-04-24 LAB — TROPONIN I (HIGH SENSITIVITY)
Troponin I (High Sensitivity): <2 ng/L (ref <18)
Troponin I (High Sensitivity): <2 ng/L (ref <18)

## 2021-04-24 LAB — PREGNANCY, URINE: Preg Test, Ur: NEGATIVE

## 2021-04-24 MED ORDER — NITROGLYCERIN 0.4 MG SL SUBL
0.4000 mg | SUBLINGUAL_TABLET | SUBLINGUAL | Status: DC | PRN
  Administered 2021-04-24 (×3): 0.4 mg via SUBLINGUAL
  Filled 2021-04-24: qty 1

## 2021-04-24 MED ORDER — ASPIRIN 81 MG PO CHEW
324.0000 mg | CHEWABLE_TABLET | Freq: Once | ORAL | Status: AC
  Administered 2021-04-24: 324 mg via ORAL
  Filled 2021-04-24: qty 4

## 2021-04-24 NOTE — ED Notes (Signed)
ED Provider at bedside. 

## 2021-04-24 NOTE — ED Provider Notes (Signed)
[facility] DEPARTMENT Provider Note   CSN: [id number] Arrival date & time: 04/24/21  8756     History  Chief Complaint  Patient presents with   Chest Pain    Jasmine Weber is a 51 y.o. female.  HPI     51 year old female with a history of hyperlipidemia, anxiety and fibroids presents with concern for intermittent chest pain.  Reports that over the last week she has had chest pain that feels like a pressure in the middle of her chest.  Reports right now the pain is a 6 out of 10.   It has been coming and going spontaneously over the last week.  It occurred yesterday while she was at church, just sitting there, and this morning when she woke up it was there.  Yesterday it was radiating somewhat to her back, but denies any radiation of the pain today.  Reports the pain is worse when she takes a deep breath.  Denies shortness of breath, nausea, vomiting, leg pain or swelling.  Reports history of anxiety and intermittent palpitations, but not associate to these events.  She has had some lightheadedness since yesterday.  Reports that she vapes, but otherwise denies history of smoking, drug use.  Her mother had a history of congestive heart failure at a young age, not clear if ischemic, by her description sounds to be hypertensive related, died at the age of 87  Her father also has a history of heart disease, and history of PE is on lifelong Coumadin.  Brother also has a history of blood clots.  Denies leg pain or swelling, estrogen use, cancer history, recent surgeries or immobilization, long trips car airplane  Denies cough, congestion, fever, abdominal pain   Past Medical History:  Diagnosis Date   Anxiety    Fibroids    Hypercholesteremia     Home Medications Prior to Admission medications   Medication Sig Start Date End Date Taking? Authorizing Provider  azithromycin (ZITHROMAX) 250 MG tablet Take 2 tabs po day 1, then take 1 tab po daily 03/24/20    Mayers, Cari S, PA-C  ibuprofen (ADVIL) 600 MG tablet Take 1 tablet (600 mg total) by mouth every 8 (eight) hours as needed. Take with food. 10/30/19   Lajean Saver, MD  methocarbamol (ROBAXIN) 750 MG tablet Take 1 tablet (750 mg total) by mouth 3 (three) times daily as needed (muscle spasm/pain). 10/30/19   Lajean Saver, MD  ondansetron (ZOFRAN ODT) 4 MG disintegrating tablet Take 1 tablet (4 mg total) by mouth every 8 (eight) hours as needed for nausea or vomiting. 03/24/20   Mayers, Cari S, PA-C  dicyclomine (BENTYL) 20 MG tablet Take 1 tablet (20 mg total) by mouth 2 (two) times daily. 07/01/18 10/30/19  Henderly, Britni A, PA-C  DULoxetine (CYMBALTA) 20 MG capsule Take 20 mg by mouth 2 (two) times daily.  10/30/19  [provider]  zolpidem (AMBIEN) 10 MG tablet Take 10 mg by mouth at bedtime as needed for sleep.  10/30/19  [provider]      Allergies    Patient has no known allergies.    Review of Systems   Review of Systems See above Physical Exam Updated Vital Signs BP (!) 147/84 (BP Location: Right Arm)    Pulse 82    Temp 98.5 F (36.9 C) (Oral)    Resp 17    Ht 5\' 7"  (1.702 m)    Wt 85.7 kg    LMP 04/03/2021  SpO2 100%    BMI 29.60 kg/m  Physical Exam Vitals and nursing note reviewed.  Constitutional:      General: She is not in acute distress.    Appearance: She is well-developed. She is not diaphoretic.  HENT:     Head: Normocephalic and atraumatic.  Eyes:     Conjunctiva/sclera: Conjunctivae normal.  Cardiovascular:     Rate and Rhythm: Normal rate and regular rhythm.     Heart sounds: Normal heart sounds. No murmur heard.   No friction rub. No gallop.  Pulmonary:     Effort: Pulmonary effort is normal. No respiratory distress.     Breath sounds: Normal breath sounds. No wheezing or rales.  Abdominal:     General: There is no distension.     Palpations: Abdomen is soft.     Tenderness: There is no abdominal tenderness. There is no guarding.   Musculoskeletal:        General: No tenderness.     Cervical back: Normal range of motion.  Skin:    General: Skin is warm and dry.     Findings: No erythema or rash.  Neurological:     Mental Status: She is alert and oriented to person, place, and time.    ED Results / Procedures / Treatments   Labs (all labs ordered are listed, but only abnormal results are displayed) Labs Reviewed  CBC WITH DIFFERENTIAL/PLATELET - Abnormal; Notable for the following components:      Result Value   MCV 78.0 (*)    All other components within normal limits  COMPREHENSIVE METABOLIC PANEL - Abnormal; Notable for the following components:   Glucose, Bld 124 (*)    Calcium 8.7 (*)    All other components within normal limits  PREGNANCY, URINE  D-DIMER, QUANTITATIVE  TROPONIN I (HIGH SENSITIVITY)  TROPONIN I (HIGH SENSITIVITY)    EKG EKG Interpretation  Date/Time:  Monday April 24 2021 07:42:36 EST Ventricular Rate:  85 PR Interval:  147 QRS Duration: 108 QT Interval:  386 QTC Calculation: 459 R Axis:   36 Text Interpretation: Sinus rhythm Low voltage, precordial leads Nonspecific T abnormalities, diffuse leads No significant change since last tracing Confirmed by Gareth Morgan [id number]) on 04/24/2021 8:01:15 AM  Radiology DG Chest 2 View  Result Date: 04/24/2021 CLINICAL DATA:  Chest pain EXAM: CHEST - 2 VIEW COMPARISON:  07/27/2020 FINDINGS: The heart size and mediastinal contours are within normal limits. Both lungs are clear. The visualized skeletal structures are unremarkable. IMPRESSION: No acute abnormality of the lungs. Electronically Signed   By: Delanna Ahmadi M.D.   On: 04/24/2021 08:25    Procedures Procedures    Medications Ordered in ED Medications  aspirin chewable tablet 324 mg (324 mg Oral Given 04/24/21 4696)    ED Course/ Medical Decision Making/ A&P                             51 year old female with a history of hyperlipidemia, anxiety and fibroids  presents with concern for intermittent chest pain.    Differential diagnosis for chest pain includes pulmonary embolus, dissection, pneumothorax, pneumonia, ACS, myocarditis, pericarditis.  EKG was done and evaluate by me and showed no signs of pericarditis or STE, does show diffuse TW abnormality--has had similar TW changes on past ECG. Chest x-ray was done and evaluated by me and radiology and showed no sign of pneumonia or pneumothorax. Patient is moderate risk Wells 3 (for  high suspicion with family hx) with a negative ddimer, no dyspnea, and have low suspicion for PE.  P Do not feel history or exam are consistent with aortic dissection.   Had stress test in 2021 which was without acute abnormalities. Initially discussed with patient and hospitalist possible admission, however called Cardiology to discuss further evaluation. Dr. Marlou Porch of Cardiology reviewed ECG and stress test, discussed history, will contact office for expedited outpatient follow up.  Discussed with patient, given normal troponins, no exertional pain, feel close outpatient follow up with cardiology and strict return precautions is appropriate. CP is improved.  Patient discharged in stable condition with understanding of reasons to return.          Final Clinical Impression(s) / ED Diagnoses Final diagnoses:  Chest pain, unspecified type    Rx / DC Orders ED Discharge Orders     None         Gareth Morgan, MD 04/24/21 2223

## 2021-04-24 NOTE — ED Triage Notes (Signed)
Triage and EKG delayed r/t patient going to restroom upon arrival

## 2021-04-24 NOTE — ED Triage Notes (Signed)
Pt arrives ambulatory to ED with reports of central CP that has been off and on for a little over a week. States it started again this morning. Some radiation into central back. Some lightheadedness with symptoms yesterday.

## 2021-04-26 ENCOUNTER — Ambulatory Visit: Payer: Managed Care, Other (non HMO) | Admitting: Cardiology

## 2021-07-19 ENCOUNTER — Ambulatory Visit: Payer: Managed Care, Other (non HMO) | Admitting: Cardiovascular Disease

## 2021-12-10 ENCOUNTER — Emergency Department (HOSPITAL_BASED_OUTPATIENT_CLINIC_OR_DEPARTMENT_OTHER): Payer: Managed Care, Other (non HMO)

## 2021-12-10 ENCOUNTER — Encounter (HOSPITAL_BASED_OUTPATIENT_CLINIC_OR_DEPARTMENT_OTHER): Payer: Self-pay | Admitting: Emergency Medicine

## 2021-12-10 ENCOUNTER — Other Ambulatory Visit: Payer: Self-pay

## 2021-12-10 ENCOUNTER — Emergency Department (HOSPITAL_BASED_OUTPATIENT_CLINIC_OR_DEPARTMENT_OTHER)
Admission: EM | Admit: 2021-12-10 | Discharge: 2021-12-10 | Disposition: A | Discharge status: Home / Self Care | Payer: Managed Care, Other (non HMO) | Attending: Emergency Medicine | Admitting: Emergency Medicine

## 2021-12-10 DIAGNOSIS — Y92410 Unspecified street and highway as the place of occurrence of the external cause: Secondary | ICD-10-CM | POA: Diagnosis not present

## 2021-12-10 DIAGNOSIS — S161XXA Strain of muscle, fascia and tendon at neck level, initial encounter: Secondary | ICD-10-CM

## 2021-12-10 DIAGNOSIS — S46911A Strain of unspecified muscle, fascia and tendon at shoulder and upper arm level, right arm, initial encounter: Secondary | ICD-10-CM

## 2021-12-10 DIAGNOSIS — S199XXA Unspecified injury of neck, initial encounter: Secondary | ICD-10-CM | POA: Diagnosis present
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
## 2021-12-10 NOTE — Discharge Instructions (Signed)
If shoulder next discomfort continues recommend you make an appointment follow-up with sports medicine.  CT neck x-ray of right shoulder without any bony abnormalities.  Recommend over-the-counter extra strength Tylenol along with Motrin/Advil or Aleve/Naprosyn as needed for discomfort.

## 2021-12-15 ENCOUNTER — Ambulatory Visit (INDEPENDENT_AMBULATORY_CARE_PROVIDER_SITE_OTHER): Payer: Self-pay | Admitting: Family Medicine

## 2021-12-15 ENCOUNTER — Encounter: Payer: Self-pay | Admitting: Family Medicine

## 2021-12-15 VITALS — BP 150/84 | Ht 67.0 in | Wt 198.0 lb

## 2021-12-15 DIAGNOSIS — S161XXA Strain of muscle, fascia and tendon at neck level, initial encounter: Secondary | ICD-10-CM | POA: Insufficient documentation

## 2021-12-15 DIAGNOSIS — M898X1 Other specified disorders of bone, shoulder: Secondary | ICD-10-CM

## 2021-12-15 MED ORDER — METHYLPREDNISOLONE ACETATE 40 MG/ML IJ SUSP
40.0000 mg | Freq: Once | INTRAMUSCULAR | Status: AC
  Administered 2021-12-15: 40 mg via INTRAMUSCULAR

## 2021-12-15 MED ORDER — IBUPROFEN 600 MG PO TABS: 600.0000 mg | ORAL_TABLET | Freq: Three times a day (TID) | ORAL | 0 refills | Status: DC | PRN

## 2021-12-15 MED ORDER — KETOROLAC TROMETHAMINE 30 MG/ML IJ SOLN
30.0000 mg | Freq: Once | INTRAMUSCULAR | Status: AC
  Administered 2021-12-15: 30 mg via INTRAMUSCULAR
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
## 2021-12-15 NOTE — Patient Instructions (Signed)
Nice to meet you Please continue heat  Please try ibuprofen as needed  Please try the exercises   Please send me a message in MyChart with any questions or updates.  Please see me back in 3 weeks.   --Dr. Raeford Razor
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
## 2022-01-05 ENCOUNTER — Ambulatory Visit: Payer: Self-pay

## 2022-01-05 ENCOUNTER — Ambulatory Visit (INDEPENDENT_AMBULATORY_CARE_PROVIDER_SITE_OTHER): Payer: Self-pay | Admitting: Family Medicine

## 2022-01-05 ENCOUNTER — Encounter: Payer: Self-pay | Admitting: Family Medicine

## 2022-01-05 VITALS — BP 130/88 | Ht 67.0 in | Wt 198.0 lb

## 2022-01-05 DIAGNOSIS — S161XXD Strain of muscle, fascia and tendon at neck level, subsequent encounter: Secondary | ICD-10-CM

## 2022-01-05 MED ORDER — METHYLPREDNISOLONE ACETATE 40 MG/ML IJ SUSP
40.0000 mg | Freq: Once | INTRAMUSCULAR | Status: AC
  Administered 2022-01-05: 40 mg via INTRAMUSCULAR

## 2022-01-05 NOTE — Patient Instructions (Signed)
Good to see you Please continue heat  Please continue the exercises   Please send me a message in MyChart with any questions or updates.  Please see me back in 4 weeks.   --Dr. Raeford Razor

## 2022-01-05 NOTE — Progress Notes (Signed)
  Jasmine Weber - 51 y.o. female MRN [id number]  Date of birth: [birth year]  SUBJECTIVE:  Including CC & ROS.  No chief complaint on file.   Jasmine Weber is a 51 y.o. female that is following up for her ongoing neck and periscapular pain.  Still having ongoing pain.  No radicular pain.    Review of Systems See HPI   HISTORY: Past Medical, Surgical, Social, and Family History Reviewed & Updated per EMR.   Pertinent Historical Findings include:  Past Medical History:  Diagnosis Date   Anxiety    Chest pain    COVID    Fibroids    Hypercholesteremia    Lightheadedness     Past Surgical History:  Procedure Laterality Date   ABDOMINAL HYSTERECTOMY     TUBAL LIGATION     WRIST SURGERY Right    tendonitis     PHYSICAL EXAM:  VS: BP 130/88 (BP Location: Left Arm, Patient Position: Sitting)   Ht '5\' 7"'$  (1.702 m)   Wt 198 lb (89.8 kg)   LMP 04/03/2021   BMI 31.01 kg/m  Physical Exam Gen: NAD, alert, cooperative with exam, well-appearing MSK:  Neurovascularly intact     Aspiration/Injection Procedure Note Jasmine Weber 1971-01-02  Procedure: Injection Indications: Right trigger points pain  Procedure Details Consent: Risks of procedure as well as the alternatives and risks of each were explained to the (patient/caregiver).  Consent for procedure obtained. Time Out: Verified patient identification, verified procedure, site/side was marked, verified correct patient position, special equipment/implants available, medications/allergies/relevent history reviewed, required imaging and test results available.  Performed.  The area was cleaned with iodine and alcohol swabs.    The right trapezius, levator scapula and rhomboid major, was injected using 2 cc of 1% lidocaine, 1 cc's of 40 mg Depo-Medrol and 2 cc's of 0.25% bupivacaine with a 25 1 1/2" needle.  Ultrasound was used. Images were obtained in short views showing the injection.     A sterile dressing was  applied.  Patient did tolerate procedure well.     ASSESSMENT & PLAN:   Cervical strain Acutely occurring. Continues to have pain at the base of the neck and around the shoulder blade.  -Counseled on home exercise therapy and supportive care. -Trigger point injections today. -Consider further imaging or physical therapy.
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
## 2022-02-01 ENCOUNTER — Ambulatory Visit: Payer: Self-pay | Admitting: Family Medicine

## 2022-04-13 ENCOUNTER — Emergency Department (HOSPITAL_BASED_OUTPATIENT_CLINIC_OR_DEPARTMENT_OTHER)
Admission: EM | Admit: 2022-04-13 | Discharge: 2022-04-13 | Disposition: A | Discharge status: Home / Self Care | Payer: No Typology Code available for payment source | Attending: Emergency Medicine | Admitting: Emergency Medicine

## 2022-04-13 ENCOUNTER — Other Ambulatory Visit: Payer: Self-pay

## 2022-04-13 DIAGNOSIS — M546 Pain in thoracic spine: Secondary | ICD-10-CM | POA: Insufficient documentation

## 2022-04-13 DIAGNOSIS — M545 Low back pain, unspecified: Secondary | ICD-10-CM | POA: Insufficient documentation

## 2022-04-13 DIAGNOSIS — M25511 Pain in right shoulder: Secondary | ICD-10-CM | POA: Diagnosis not present

## 2022-04-13 DIAGNOSIS — M25512 Pain in left shoulder: Secondary | ICD-10-CM | POA: Insufficient documentation

## 2022-04-13 DIAGNOSIS — Y92410 Unspecified street and highway as the place of occurrence of the external cause: Secondary | ICD-10-CM | POA: Diagnosis not present

## 2022-04-13 MED ORDER — KETOROLAC TROMETHAMINE 15 MG/ML IJ SOLN
15.0000 mg | Freq: Once | INTRAMUSCULAR | Status: AC
  Administered 2022-04-13: 15 mg via INTRAMUSCULAR
  Filled 2022-04-13: qty 1

## 2022-04-13 MED ORDER — MELOXICAM 15 MG PO TABS: 15.0000 mg | ORAL_TABLET | Freq: Every day | ORAL | 0 refills | Status: DC
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
## 2022-04-14 ENCOUNTER — Telehealth (HOSPITAL_BASED_OUTPATIENT_CLINIC_OR_DEPARTMENT_OTHER): Payer: Self-pay | Admitting: Student

## 2022-04-14 DIAGNOSIS — M546 Pain in thoracic spine: Secondary | ICD-10-CM

## 2022-04-14 MED ORDER — MELOXICAM 15 MG PO TABS: 15.0000 mg | ORAL_TABLET | Freq: Every day | ORAL | 0 refills | Status: AC

## 2022-04-14 NOTE — Telephone Encounter (Signed)
Patient called and requested Meloxicam be sent to different pharmacy.

## 2022-07-09 ENCOUNTER — Encounter: Payer: Self-pay | Admitting: *Deleted

## 2023-02-06 ENCOUNTER — Other Ambulatory Visit: Payer: Self-pay

## 2023-02-06 ENCOUNTER — Encounter (HOSPITAL_BASED_OUTPATIENT_CLINIC_OR_DEPARTMENT_OTHER): Payer: Self-pay | Admitting: Emergency Medicine

## 2023-02-06 ENCOUNTER — Emergency Department (HOSPITAL_BASED_OUTPATIENT_CLINIC_OR_DEPARTMENT_OTHER)
Admission: EM | Admit: 2023-02-06 | Discharge: 2023-02-06 | Disposition: A | Discharge status: Home / Self Care | Payer: MEDICAID | Attending: Emergency Medicine | Admitting: Emergency Medicine

## 2023-02-06 DIAGNOSIS — S61210A Laceration without foreign body of right index finger without damage to nail, initial encounter: Secondary | ICD-10-CM | POA: Insufficient documentation

## 2023-02-06 DIAGNOSIS — Z23 Encounter for immunization: Secondary | ICD-10-CM | POA: Insufficient documentation

## 2023-02-06 DIAGNOSIS — Y93G1 Activity, food preparation and clean up: Secondary | ICD-10-CM | POA: Insufficient documentation

## 2023-02-06 DIAGNOSIS — W268XXA Contact with other sharp object(s), not elsewhere classified, initial encounter: Secondary | ICD-10-CM | POA: Insufficient documentation

## 2023-02-06 DIAGNOSIS — S6991XA Unspecified injury of right wrist, hand and finger(s), initial encounter: Secondary | ICD-10-CM | POA: Diagnosis present

## 2023-02-06 MED ORDER — TETANUS-DIPHTH-ACELL PERTUSSIS 5-2.5-18.5 LF-MCG/0.5 IM SUSY
0.5000 mL | PREFILLED_SYRINGE | Freq: Once | INTRAMUSCULAR | Status: AC
  Administered 2023-02-06: 0.5 mL via INTRAMUSCULAR
  Filled 2023-02-06: qty 0.5

## 2023-02-06 MED ORDER — OXYCODONE-ACETAMINOPHEN 5-325 MG PO TABS
1.0000 | ORAL_TABLET | Freq: Once | ORAL | Status: AC
  Administered 2023-02-06: 1 via ORAL
  Filled 2023-02-06: qty 1

## 2023-02-06 MED ORDER — LIDOCAINE HCL (PF) 1 % IJ SOLN: 30.0000 mL | Freq: Once | INTRAMUSCULAR | Status: DC

## 2023-02-06 MED ORDER — LIDOCAINE HCL (PF) 1 % IJ SOLN: 20.0000 mL | Freq: Once | INTRAMUSCULAR | Status: DC

## 2023-02-06 MED ORDER — LIDOCAINE-EPINEPHRINE-TETRACAINE (LET) TOPICAL GEL: 3.0000 mL | Freq: Once | TOPICAL | Status: DC

## 2023-02-06 MED ORDER — BACITRACIN ZINC 500 UNIT/GM EX OINT: TOPICAL_OINTMENT | Freq: Two times a day (BID) | CUTANEOUS | Status: DC

## 2023-02-06 MED ORDER — LIDOCAINE HCL 1 % IJ SOLN
20.0000 mL | Freq: Once | INTRAMUSCULAR | Status: AC
  Administered 2023-02-06: 20 mL via INTRADERMAL

## 2023-02-06 MED ORDER — LIDOCAINE 4 % EX CREA
TOPICAL_CREAM | Freq: Once | CUTANEOUS | Status: AC
  Administered 2023-02-06: 1 via TOPICAL

## 2023-02-06 NOTE — ED Notes (Signed)
Pt alert and oriented X 4 at the time of discharge. RR even and unlabored. No acute distress noted. Pt verbalized understanding of discharge instructions as discussed. Pt ambulatory to lobby at time of discharge.

## 2023-02-06 NOTE — Discharge Instructions (Signed)
As discussed, replace the steri-strips if they come off. Leave the dressing on your finger for the next 24 hours. Avoid getting your finger wet while the Dermabond glue is on.   Follow up with your PCP in 1 week for reevaluation/  Return to the ED if: Your pain suddenly increases and is severe. You develop severe swelling around the wound. The wound is on your hand or foot, and you cannot properly move a finger or toe. The wound is on your hand or foot, and you notice that your fingers or toes look pale or bluish. You have a red streak going away from your wound. You develop painful lumps near the wound or on skin anywhere else on your body.

## 2023-02-06 NOTE — ED Notes (Signed)
PA in to suture finger

## 2023-02-06 NOTE — ED Provider Notes (Signed)
[place] [facility] [place] Provider Note   CSN: [id number] Arrival date & time: 02/06/23  1355     History  Chief Complaint  Patient presents with   Finger Injury    Right index    Jasmine Weber is a 52 y.o. female with no significant past medical history presents the ED today for laceration.  Patient states that she was cleaning of vegetable cutter blade when it sliced the tip of her right index finger.  Denies loss of sensation, weakness, or decreased range of motion to the finger.  She is right-hand dominant.  Tetanus shot is not up-to-date.  No other complaints or concerns at this time.    Home Medications Prior to Admission medications   Medication Sig Start Date End Date Taking? Authorizing Provider  ibuprofen (ADVIL) 600 MG tablet Take 1 tablet (600 mg total) by mouth every 8 (eight) hours as needed. Take with food. 12/15/21   Myra Rude, MD  dicyclomine (BENTYL) 20 MG tablet Take 1 tablet (20 mg total) by mouth 2 (two) times daily. 07/01/18 10/30/19  Henderly, Britni A, PA-C  DULoxetine (CYMBALTA) 20 MG capsule Take 20 mg by mouth 2 (two) times daily.  10/30/19  [provider]  zolpidem (AMBIEN) 10 MG tablet Take 10 mg by mouth at bedtime as needed for sleep.  10/30/19  [provider]      Allergies    Patient has no known allergies.    Review of Systems   Review of Systems  Skin:        laceration  All other systems reviewed and are negative.   Physical Exam Updated Vital Signs BP (!) 143/77   Pulse 70   Temp 97.9 F (36.6 C) (Oral)   Resp 18   Wt 93.9 kg   LMP 04/03/2021   SpO2 99%   BMI 32.42 kg/m  Physical Exam Vitals and nursing note reviewed.  Constitutional:      General: She is not in acute distress.    Appearance: Normal appearance.  HENT:     Head: Normocephalic and atraumatic.     Mouth/Throat:     Mouth: Mucous membranes are moist.  Eyes:     Conjunctiva/sclera: Conjunctivae normal.      Pupils: Pupils are equal, round, and reactive to light.  Cardiovascular:     Rate and Rhythm: Normal rate and regular rhythm.     Pulses: Normal pulses.  Pulmonary:     Effort: Pulmonary effort is normal.     Breath sounds: Normal breath sounds.  Musculoskeletal:     Comments: Vertical laceration present at the pad of the right index finger with ROM, sensation, and strength intact. Capillary refill is within 2 seconds.  Skin:    General: Skin is warm and dry.     Findings: No rash.     Comments: 3 cm laceration to the pad of the right index finger. The wound is not currently bleeding.  Neurological:     General: No focal deficit present.     Mental Status: She is alert.     Sensory: No sensory deficit.     Motor: No weakness.  Psychiatric:        Mood and Affect: Mood normal.        Behavior: Behavior normal.    ED Results / Procedures / Treatments   Labs (all labs ordered are listed, but only abnormal results are displayed) Labs Reviewed - No data to display  EKG None  Radiology No results found.  Procedures .Marland KitchenLaceration Repair  Date/Time: 02/06/2023 7:56 PM  Performed by: Maxwell Marion, PA-C Authorized by: Maxwell Marion, PA-C   Consent:    Consent obtained:  Verbal   Consent given by:  Patient Laceration details:    Location:  Finger   Finger location:  R index finger   Length (cm):  3 Exploration:    Hemostasis achieved with:  Tourniquet Treatment:    Area cleansed with:  Povidone-iodine and saline Skin repair:    Repair method:  Tissue adhesive Approximation:    Approximation:  Close Repair type:    Repair type:  Simple Post-procedure details:    Dressing:  Non-adherent dressing   Procedure completion:  Tolerated well, no immediate complications Comments:     Patient reported that her finger was not numb after nerve block and injecting lidocaine directly at laceration site. We ultimately decided to use Dermabond to close the laceration.      Medications Ordered in ED Medications  Tdap (BOOSTRIX) injection 0.5 mL (0.5 mLs Intramuscular Given 02/06/23 1457)  oxyCODONE-acetaminophen (PERCOCET/ROXICET) 5-325 MG per tablet 1 tablet (1 tablet Oral Given 02/06/23 1456)  lidocaine (XYLOCAINE) 1 % (with pres) injection 20 mL (20 mLs Intradermal Given by Other 02/06/23 1503)  lidocaine (LMX) 4 % cream (1 Application Topical Given 02/06/23 1734)    ED Course/ Medical Decision Making/ A&P                                 Medical Decision Making Risk OTC drugs. Prescription drug management.   This patient presents to the ED for concern of finger injury, this involves an extensive number of treatment options, and is a complaint that carries with it a high risk of complications and morbidity.   Differential diagnosis includes: Abrasion, laceration, contusion, etc.   Comorbidities  See HPI above   Additional History  Additional history obtained from prior records.   Problem List / ED Course / Critical Interventions / Medication Management  Right index finger laceration I ordered medications including: Percocet for pain Reevaluation of the patient after these medicines showed that the patient improved I have reviewed the patients home medicines and have made adjustments as needed   Social Determinants of Health  Housing   Test / Admission - Considered  Patient is stable and safe for discharge home. Return precautions given.        Final Clinical Impression(s) / ED Diagnoses Final diagnoses:  Laceration of right index finger without foreign body without damage to nail, initial encounter    Rx / DC Orders ED Discharge Orders     None         Maxwell Marion, PA-C 02/06/23 2000    Terald Sleeper, MD 02/07/23 [phone]

## 2023-02-06 NOTE — ED Triage Notes (Signed)
Right index laceration , was using veggies cutting blade . Obvious laceration to tip of index. Bleeding controlled , pressure dressing applied in triage .

## 2023-06-06 ENCOUNTER — Other Ambulatory Visit: Payer: Self-pay | Admitting: Medical Genetics

## 2023-09-02 ENCOUNTER — Other Ambulatory Visit: Payer: Self-pay

## 2023-09-02 ENCOUNTER — Emergency Department (HOSPITAL_BASED_OUTPATIENT_CLINIC_OR_DEPARTMENT_OTHER): Payer: MEDICAID

## 2023-09-02 ENCOUNTER — Encounter (HOSPITAL_BASED_OUTPATIENT_CLINIC_OR_DEPARTMENT_OTHER): Payer: Self-pay | Admitting: Emergency Medicine

## 2023-09-02 ENCOUNTER — Emergency Department (HOSPITAL_BASED_OUTPATIENT_CLINIC_OR_DEPARTMENT_OTHER)
Admission: EM | Admit: 2023-09-02 | Discharge: 2023-09-02 | Disposition: A | Discharge status: Home / Self Care | Payer: MEDICAID | Attending: Emergency Medicine | Admitting: Emergency Medicine

## 2023-09-02 DIAGNOSIS — B349 Viral infection, unspecified: Secondary | ICD-10-CM | POA: Insufficient documentation

## 2023-09-02 DIAGNOSIS — R509 Fever, unspecified: Secondary | ICD-10-CM | POA: Insufficient documentation

## 2023-09-02 DIAGNOSIS — J029 Acute pharyngitis, unspecified: Secondary | ICD-10-CM | POA: Diagnosis present

## 2023-09-02 LAB — RESP PANEL BY RT-PCR (RSV, FLU A&B, COVID)  RVPGX2
Influenza A by PCR: NEGATIVE
Influenza B by PCR: NEGATIVE
Resp Syncytial Virus by PCR: NEGATIVE
SARS Coronavirus 2 by RT PCR: NEGATIVE

## 2023-09-02 LAB — GROUP A STREP BY PCR: Group A Strep by PCR: NOT DETECTED

## 2023-09-02 MED ORDER — ACETAMINOPHEN 500 MG PO TABS
1000.0000 mg | ORAL_TABLET | Freq: Once | ORAL | Status: AC
  Administered 2023-09-02: 1000 mg via ORAL
  Filled 2023-09-02: qty 2

## 2023-09-02 MED ORDER — ONDANSETRON HCL 4 MG PO TABS: 4.0000 mg | ORAL_TABLET | Freq: Three times a day (TID) | ORAL | Status: DC | PRN

## 2023-09-02 MED ORDER — ONDANSETRON HCL 4 MG PO TABS: 4.0000 mg | ORAL_TABLET | Freq: Three times a day (TID) | ORAL | 0 refills | Status: AC | PRN

## 2023-09-02 MED ORDER — ONDANSETRON 4 MG PO TBDP
4.0000 mg | ORAL_TABLET | Freq: Once | ORAL | Status: AC
  Administered 2023-09-02: 4 mg via ORAL
  Filled 2023-09-02: qty 1

## 2023-09-02 NOTE — Discharge Instructions (Signed)
 You were seen in the emergency department for symptoms including sore throat fevers chills vomiting diarrhea Your COVID/influenza/RSV test were all negative Your strep test was negative as well The chest x-ray did not show any pneumonia that would require treatment with antibiotics We have called in a prescription for Zofran  for you to pick up from your pharmacy and begin taking as directed for nausea and vomiting Keep well-hydrated at home drink plenty of fluids such as Pedialyte, Gatorade or liquid IV Take Tylenol  Motrin  as directed for fevers and discomfort Follow-up with your primary care doctor in 1 week for reevaluation Return to the emergency department for trouble breathing or any other concerns

## 2023-09-02 NOTE — ED Provider Notes (Signed)
 [place] [facility] [place] Provider Note   CSN: [id number] Arrival date & time: 09/02/23  1412     History  Chief Complaint  Patient presents with   Sore Throat    Jasmine Weber is a 53 y.o. female.  Who presents the ED for sore throat.  3 days of ongoing viral symptoms such as cough, congestion, sore throat, nausea vomiting diarrhea fevers and chills at home.  Suspect she may have picked up a viral illness at the [facility] center since she had a visit there which directly preceded her onset of illness.  Coughing has been productive with some yellow/green sputum.  No chest pain shortness of breath or urinary symptoms.   Sore Throat       Home Medications Prior to Admission medications   Medication Sig Start Date End Date Taking? Authorizing Provider  ondansetron  (ZOFRAN ) 4 MG tablet Take 1 tablet (4 mg total) by mouth every 8 (eight) hours as needed for up to 4 days for nausea or vomiting. 09/02/23 09/06/23 Yes Sallyanne Creamer, DO  ibuprofen  (ADVIL ) 600 MG tablet Take 1 tablet (600 mg total) by mouth every 8 (eight) hours as needed. Take with food. 12/15/21   Margaree Shark, MD  dicyclomine  (BENTYL ) 20 MG tablet Take 1 tablet (20 mg total) by mouth 2 (two) times daily. 07/01/18 10/30/19  Henderly, Britni A, PA-C  DULoxetine (CYMBALTA) 20 MG capsule Take 20 mg by mouth 2 (two) times daily.  10/30/19  [provider]  zolpidem (AMBIEN) 10 MG tablet Take 10 mg by mouth at bedtime as needed for sleep.  10/30/19  [provider]      Allergies    Patient has no known allergies.    Review of Systems   Review of Systems  Physical Exam Updated Vital Signs BP 130/86 (BP Location: Right Arm)   Pulse (!) 108   Temp 98.8 F (37.1 C) (Oral)   Resp 20   Wt 95.3 kg   LMP 04/03/2021   SpO2 100%   BMI 32.89 kg/m  Physical Exam Vitals and nursing note reviewed.  HENT:     Head: Normocephalic and atraumatic.     Nose: Congestion  present.     Mouth/Throat:     Pharynx: Oropharyngeal exudate present. No pharyngeal swelling or posterior oropharyngeal erythema.  Eyes:     Pupils: Pupils are equal, round, and reactive to light.  Cardiovascular:     Rate and Rhythm: Normal rate and regular rhythm.  Pulmonary:     Effort: Pulmonary effort is normal.     Breath sounds: Normal breath sounds.  Abdominal:     Palpations: Abdomen is soft.     Tenderness: There is no abdominal tenderness.  Skin:    General: Skin is warm and dry.  Neurological:     Mental Status: She is alert.  Psychiatric:        Mood and Affect: Mood normal.     ED Results / Procedures / Treatments   Labs (all labs ordered are listed, but only abnormal results are displayed) Labs Reviewed  RESP PANEL BY RT-PCR (RSV, FLU A&B, COVID)  RVPGX2  GROUP A STREP BY PCR    EKG None  Radiology No results found.  Procedures Procedures    Medications Ordered in ED Medications  ondansetron  (ZOFRAN -ODT) disintegrating tablet 4 mg (4 mg Oral Given 09/02/23 1603)  acetaminophen  (TYLENOL ) tablet 1,000 mg (1,000 mg Oral Given 09/02/23 1603)  ED Course/ Medical Decision Making/ A&P Clinical Course as of 09/02/23 1621  Mon Sep 02, 2023  1619 Chest x-ray shows no focal consolidation that would merit outpatient antibiotics for pneumonia.  Suspect likely viral syndrome.  Will call in prescription for Zofran  instructed with symptomatic management with Tylenol  Motrin  hydration.  Appropriate discharge with PCP follow-up at this time.  Return precautions would be worrisome for severe respiratory illness were discussed with the patient and her husband in detail. [MP]    Clinical Course User Index [MP] Sallyanne Creamer, DO                                 Medical Decision Making 53 year old female with history as above presenting for day 3 of viral symptoms coughing congestion sore throat nausea vomiting diarrhea.  Suspect most likely viral illness.  COVID  influenza RSV all negative.  Strep pharyngitis negative.  Will obtain chest x-ray to look for pneumonia as source of her fevers and productive coughing and start outpatient antibiotics if indicated.  Otherwise this most likely a viral illness will: Zofran  for management of nausea vomiting at home and check for symptomatic management with Tylenol  Motrin  and adequate hydration.  She is otherwise well-appearing and stable on room air will be appropriate for outpatient management of viral illness and pneumonia  Amount and/or Complexity of Data Reviewed Radiology: ordered.  Risk OTC drugs. Prescription drug management.           Final Clinical Impression(s) / ED Diagnoses Final diagnoses:  Viral syndrome    Rx / DC Orders ED Discharge Orders          Ordered    ondansetron  (ZOFRAN ) 4 MG tablet  Every 8 hours PRN        09/02/23 1540              Rafael Bun A, DO 09/02/23 1621

## 2023-09-02 NOTE — ED Triage Notes (Signed)
 Sore throat x 1 day , vomiting . Cough x 3 days . Shortness of breath . Feels something sitting on her chest she said . Hoarse voice .

## 2023-10-06 ENCOUNTER — Encounter (HOSPITAL_BASED_OUTPATIENT_CLINIC_OR_DEPARTMENT_OTHER): Payer: Self-pay | Admitting: Emergency Medicine

## 2023-10-06 ENCOUNTER — Emergency Department (HOSPITAL_BASED_OUTPATIENT_CLINIC_OR_DEPARTMENT_OTHER): Payer: MEDICAID

## 2023-10-06 ENCOUNTER — Emergency Department (HOSPITAL_BASED_OUTPATIENT_CLINIC_OR_DEPARTMENT_OTHER)
Admission: EM | Admit: 2023-10-06 | Discharge: 2023-10-06 | Disposition: A | Discharge status: Home / Self Care | Payer: MEDICAID | Attending: Emergency Medicine | Admitting: Emergency Medicine

## 2023-10-06 DIAGNOSIS — S92531A Displaced fracture of distal phalanx of right lesser toe(s), initial encounter for closed fracture: Secondary | ICD-10-CM | POA: Insufficient documentation

## 2023-10-06 DIAGNOSIS — W231XXA Caught, crushed, jammed, or pinched between stationary objects, initial encounter: Secondary | ICD-10-CM | POA: Diagnosis not present

## 2023-10-06 DIAGNOSIS — S92501A Displaced unspecified fracture of right lesser toe(s), initial encounter for closed fracture: Secondary | ICD-10-CM

## 2023-10-06 DIAGNOSIS — S90934A Unspecified superficial injury of right lesser toe(s), initial encounter: Secondary | ICD-10-CM | POA: Diagnosis present

## 2023-10-06 NOTE — ED Provider Notes (Signed)
 [place] EMERGENCY DEPARTMENT AT [facility] [place] Provider Note   CSN: [id number] Arrival date & time: 10/06/23  1452     Patient presents with: Toe Pain   Jasmine Weber is a 53 y.o. female presents today for right fourth toe pain after getting a jammed in an open door while she was walking last night.  Patient reports ecchymosis and swelling.  Patient denies numbness, tingling, or any other complaints at this time.    Toe Pain       Prior to Admission medications   Medication Sig Start Date End Date Taking? Authorizing Provider  ibuprofen  (ADVIL ) 600 MG tablet Take 1 tablet (600 mg total) by mouth every 8 (eight) hours as needed. Take with food. 12/15/21   Chick Venetia BRAVO, MD  dicyclomine  (BENTYL ) 20 MG tablet Take 1 tablet (20 mg total) by mouth 2 (two) times daily. 07/01/18 10/30/19  Henderly, Britni A, PA-C  DULoxetine (CYMBALTA) 20 MG capsule Take 20 mg by mouth 2 (two) times daily.  10/30/19  [provider]  zolpidem (AMBIEN) 10 MG tablet Take 10 mg by mouth at bedtime as needed for sleep.  10/30/19  [provider]    Allergies: Patient has no known allergies.    Review of Systems  Musculoskeletal:  Positive for arthralgias.    Updated Vital Signs BP (!) 141/84 (BP Location: Right Arm)   Pulse 70   Temp 98.1 F (36.7 C)   Resp 18   Ht 5' 7 (1.702 m)   Wt 95.3 kg   LMP 04/03/2021   SpO2 99%   BMI 32.89 kg/m   Physical Exam Vitals and nursing note reviewed.  Constitutional:      General: She is not in acute distress.    Appearance: She is well-developed. She is not ill-appearing.  HENT:     Head: Normocephalic and atraumatic.  Eyes:     Conjunctiva/sclera: Conjunctivae normal.  Cardiovascular:     Rate and Rhythm: Normal rate and regular rhythm.     Heart sounds: No murmur heard. Pulmonary:     Effort: Pulmonary effort is normal. No respiratory distress.     Breath sounds: Normal breath sounds.  Abdominal:     Palpations:  Abdomen is soft.     Tenderness: There is no abdominal tenderness.  Musculoskeletal:        General: Swelling and tenderness present.     Cervical back: Neck supple.     Comments: Right fourth toe with ecchymosis noted to the dorsal distal phalanx around the nailbed, no hematoma noted under the nailbed.  Patient is neurovascularly intact with +2 dorsalis pedis pulses.  No deformity or open wound noted on exam.  Skin:    General: Skin is warm and dry.     Capillary Refill: Capillary refill takes less than 2 seconds.     Findings: Bruising present.  Neurological:     Mental Status: She is alert.  Psychiatric:        Mood and Affect: Mood normal.     (all labs ordered are listed, but only abnormal results are displayed) Labs Reviewed - No data to display  EKG: None  Radiology: DG Toe 4th Right Result Date: 10/06/2023 CLINICAL DATA:  Swelling and discoloration of the fourth toe after stubbing injury last night EXAM: RIGHT FOURTH TOE COMPARISON:  None Available. FINDINGS: Dorsal base plate avulsion fracture of the distal phalanx fourth toe noted on the lateral projection. IMPRESSION: 1. Dorsal base plate avulsion fracture of  the distal phalanx fourth toe. Electronically Signed   By: Ryan Salvage M.D.   On: 10/06/2023 16:06     Procedures   Medications Ordered in the ED - No data to display                                  Medical Decision Making Amount and/or Complexity of Data Reviewed Radiology: ordered.   This patient presents to the ED for concern of toe injury differential diagnosis includes dislocation, fracture, musculoskeletal pain   Imaging Studies ordered:  I ordered imaging studies including right fourth toe x-ray I independently visualized and interpreted imaging which showed dorsal base plate avulsion fracture of the distal phalanx fourth toe I agree with the radiologist interpretation  Problem List / ED Course:  Patient placed in postop  shoe Considered for admission or further workup however patient's vital signs, physical exam, and imaging are reassuring.  Patient symptoms likely due to avulsion fracture of distal phalanx of right fourth toe.  Patient advised to take Tylenol  Motrin  as needed for pain and swelling.  Patient to follow-up with orthopedics for further evaluation workup.  Patient given return precautions.  I feel patient safe for discharge at this time.       Final diagnoses:  Closed fracture of phalanx of right fourth toe, initial encounter    ED Discharge Orders     None          Francis Ileana SAILOR, PA-C 10/06/23 1614    Ruthe Cornet, DO 10/06/23 1617

## 2023-10-06 NOTE — ED Notes (Signed)

## 2023-10-06 NOTE — ED Triage Notes (Signed)
 Pt jammed RT 4th toe into an open drawer as she was walking last night; purple discoloration/ swelling present

## 2023-10-06 NOTE — Discharge Instructions (Addendum)
 Today you were seen for right fourth toe pain.  Please continue to wear your postop shoe for support.  Please follow-up with Dr. Ernie of orthopedics.  You may rest, ice, compress, and elevate the affected limb.  You may alternate taking Tylenol /Motrin  as needed for pain.  Please follow-up with your primary care if your symptoms persist for further evaluation and workup.  Thank you for letting us  treat you today. After reviewing your imaging, I feel you are safe to go home. Please follow up with your PCP in the next several days and provide them with your records from this visit. Return to the Emergency Room if pain becomes severe or symptoms worsen.

## 2024-01-14 ENCOUNTER — Other Ambulatory Visit: Payer: Self-pay | Admitting: Medical Genetics

## 2024-01-14 DIAGNOSIS — Z006 Encounter for examination for normal comparison and control in clinical research program: Secondary | ICD-10-CM

## 2024-02-10 ENCOUNTER — Other Ambulatory Visit: Payer: Self-pay

## 2024-02-10 ENCOUNTER — Emergency Department (HOSPITAL_BASED_OUTPATIENT_CLINIC_OR_DEPARTMENT_OTHER)

## 2024-02-10 ENCOUNTER — Encounter (HOSPITAL_BASED_OUTPATIENT_CLINIC_OR_DEPARTMENT_OTHER): Payer: Self-pay

## 2024-02-10 ENCOUNTER — Emergency Department (HOSPITAL_BASED_OUTPATIENT_CLINIC_OR_DEPARTMENT_OTHER)
Admission: EM | Admit: 2024-02-10 | Discharge: 2024-02-10 | Disposition: A | Discharge status: Home / Self Care | Attending: Emergency Medicine | Admitting: Emergency Medicine

## 2024-02-10 DIAGNOSIS — N83202 Unspecified ovarian cyst, left side: Secondary | ICD-10-CM | POA: Insufficient documentation

## 2024-02-10 DIAGNOSIS — N83201 Unspecified ovarian cyst, right side: Secondary | ICD-10-CM | POA: Insufficient documentation

## 2024-02-10 DIAGNOSIS — R1030 Lower abdominal pain, unspecified: Secondary | ICD-10-CM | POA: Diagnosis present

## 2024-02-10 LAB — URINALYSIS, ROUTINE W REFLEX MICROSCOPIC
Bilirubin Urine: NEGATIVE
Glucose, UA: NEGATIVE mg/dL
Ketones, ur: NEGATIVE mg/dL
Leukocytes,Ua: NEGATIVE
Nitrite: NEGATIVE
Protein, ur: NEGATIVE mg/dL
Specific Gravity, Urine: >=1.03 (ref 1.005–1.030)
pH: 6 (ref 5.0–8.0)

## 2024-02-10 LAB — CBC
HCT: 41.9 % (ref 36.0–46.0)
Hemoglobin: 14.1 g/dL (ref 12.0–15.0)
MCH: 26.7 pg (ref 26.0–34.0)
MCHC: 33.7 g/dL (ref 30.0–36.0)
MCV: 79.4 fL — ABNORMAL LOW (ref 80.0–100.0)
Platelets: 298 K/uL (ref 150–400)
RBC: 5.28 MIL/uL — ABNORMAL HIGH (ref 3.87–5.11)
RDW: 15.4 % (ref 11.5–15.5)
WBC: 8.5 K/uL (ref 4.0–10.5)
nRBC: 0 % (ref 0.0–0.2)

## 2024-02-10 LAB — COMPREHENSIVE METABOLIC PANEL WITH GFR
ALT: 14 U/L (ref 0–44)
AST: 18 U/L (ref 15–41)
Albumin: 4.2 g/dL (ref 3.5–5.0)
Alkaline Phosphatase: 87 U/L (ref 38–126)
Anion gap: 11 (ref 5–15)
BUN: 9 mg/dL (ref 6–20)
CO2: 23 mmol/L (ref 22–32)
Calcium: 9.1 mg/dL (ref 8.9–10.3)
Chloride: 105 mmol/L (ref 98–111)
Creatinine, Ser: 0.61 mg/dL (ref 0.44–1.00)
GFR, Estimated: >60 mL/min (ref >60)
Glucose, Bld: 126 mg/dL — ABNORMAL HIGH (ref 70–99)
Potassium: 3.5 mmol/L (ref 3.5–5.1)
Sodium: 139 mmol/L (ref 135–145)
Total Bilirubin: 0.6 mg/dL (ref 0.0–1.2)
Total Protein: 7.2 g/dL (ref 6.5–8.1)

## 2024-02-10 LAB — LIPASE, BLOOD: Lipase: 17 U/L (ref 11–51)

## 2024-02-10 LAB — URINALYSIS, MICROSCOPIC (REFLEX): WBC, UA: NONE SEEN WBC/hpf (ref 0–5)

## 2024-02-10 MED ORDER — MELOXICAM 7.5 MG PO TABS: 7.5000 mg | ORAL_TABLET | Freq: Every day | ORAL | 0 refills | Status: AC

## 2024-02-10 MED ORDER — IOHEXOL 300 MG/ML  SOLN
100.0000 mL | Freq: Once | INTRAMUSCULAR | Status: AC | PRN
  Administered 2024-02-10: 100 mL via INTRAVENOUS

## 2024-02-10 MED ORDER — KETOROLAC TROMETHAMINE 15 MG/ML IJ SOLN
15.0000 mg | Freq: Once | INTRAMUSCULAR | Status: AC
  Administered 2024-02-10: 15 mg via INTRAVENOUS
  Filled 2024-02-10: qty 1

## 2024-02-10 NOTE — Discharge Instructions (Signed)
 It was a pleasure taking care of you here today  As we discussed you have bilateral ovarian cyst.  This could be the cause of your pain.  I have started on anti-inflammatory.  It is important to not take any other additional nonsteroidal anti-inflammatories while taking this medication as it can increase your risk of having gastrointestinal bleeding and kidney dysfunction  Make sure to follow-up outpatient, return for new or worsening symptoms

## 2024-02-10 NOTE — ED Provider Notes (Signed)
 [place] [facility]ENCY DEPARTMENT AT [facility] [place] Provider Note   CSN: [id number] Arrival date & time: 02/10/24  1618     Patient presents with: Abdominal Pain   Jasmine Weber is a 53 y.o. female here for evaluation of mid and lower abdominal pain.  Ongoing over the last 2 to 3 weeks.  Constant in nature.  Radiates into her back.  Has known arthritis in her back.  She states she has had a prior total abdominal hysterectomy.  She is having normal bowel movements that any blood.  Passing flatus.  No dysuria or hematuria.  No abnormal vaginal bleeding or discharge.  No chest pain, shortness of breath.  No recent falls or injuries. Dull aching sensation   HPI     Prior to Admission medications   Medication Sig Start Date End Date Taking? Authorizing Provider  meloxicam  (MOBIC ) 7.5 MG tablet Take 1 tablet (7.5 mg total) by mouth daily for 14 days. 02/10/24 02/24/24 Yes Zadaya Cuadra A, PA-C  dicyclomine  (BENTYL ) 20 MG tablet Take 1 tablet (20 mg total) by mouth 2 (two) times daily. 07/01/18 10/30/19  Nijah Orlich A, PA-C  DULoxetine (CYMBALTA) 20 MG capsule Take 20 mg by mouth 2 (two) times daily.  10/30/19  [provider]  zolpidem (AMBIEN) 10 MG tablet Take 10 mg by mouth at bedtime as needed for sleep.  10/30/19  [provider]    Allergies: Patient has no known allergies.    Review of Systems  Constitutional: Negative.   HENT: Negative.    Respiratory: Negative.    Cardiovascular: Negative.   Gastrointestinal:  Positive for abdominal pain. Negative for abdominal distention, anal bleeding, blood in stool, constipation, diarrhea, nausea, rectal pain and vomiting.  Genitourinary: Negative.   Musculoskeletal: Negative.   Skin: Negative.   Neurological: Negative.   All other systems reviewed and are negative.   Updated Vital Signs BP 133/78   Pulse 71   Temp 98.2 F (36.8 C) (Oral)   Resp 18   LMP 04/03/2021   SpO2 100%   Physical Exam Vitals  and nursing note reviewed.  Constitutional:      General: She is not in acute distress.    Appearance: She is well-developed. She is not ill-appearing.  HENT:     Head: Normocephalic and atraumatic.  Eyes:     Pupils: Pupils are equal, round, and reactive to light.  Cardiovascular:     Rate and Rhythm: Normal rate.     Heart sounds: Normal heart sounds.  Pulmonary:     Effort: Pulmonary effort is normal. No respiratory distress.     Breath sounds: Normal breath sounds.  Abdominal:     General: Bowel sounds are normal. There is no distension.     Palpations: Abdomen is soft.     Tenderness: There is abdominal tenderness in the right lower quadrant, periumbilical area, suprapubic area and left lower quadrant. There is no guarding. Negative signs include Murphy's sign and McBurney's sign.  Musculoskeletal:        General: Normal range of motion.     Cervical back: Normal range of motion.  Skin:    General: Skin is warm and dry.  Neurological:     General: No focal deficit present.     Mental Status: She is alert.  Psychiatric:        Mood and Affect: Mood normal.     (all labs ordered are listed, but only abnormal results are displayed) Labs Reviewed  COMPREHENSIVE  METABOLIC PANEL WITH GFR - Abnormal; Notable for the following components:      Result Value   Glucose, Bld 126 (*)    All other components within normal limits  CBC - Abnormal; Notable for the following components:   RBC 5.28 (*)    MCV 79.4 (*)    All other components within normal limits  URINALYSIS, ROUTINE W REFLEX MICROSCOPIC - Abnormal; Notable for the following components:   Hgb urine dipstick TRACE (*)    All other components within normal limits  URINALYSIS, MICROSCOPIC (REFLEX) - Abnormal; Notable for the following components:   Bacteria, UA RARE (*)    All other components within normal limits  LIPASE, BLOOD    EKG: None  Radiology: CT ABDOMEN PELVIS W CONTRAST Result Date: 02/10/2024 EXAM:  CT ABDOMEN AND PELVIS WITH CONTRAST 02/10/2024 07:15:00 PM TECHNIQUE: CT of the abdomen and pelvis was performed with the administration of 100 mL of iohexol  (OMNIPAQUE ) 300 MG/ML solution. Multiplanar reformatted images are provided for review. Automated exposure control, iterative reconstruction, and/or weight-based adjustment of the mA/kV was utilized to reduce the radiation dose to as low as reasonably achievable. COMPARISON: CT abdomen pelvis 09/29/2020. CLINICAL HISTORY: Abdominal pain, acute, nonlocalized; lower abd pain. FINDINGS: LOWER CHEST: No acute abnormality. LIVER: Enlarged liver measuring up to 21 cm. GALLBLADDER AND BILE DUCTS: Gallbladder is unremarkable. No biliary ductal dilatation. SPLEEN: No acute abnormality. PANCREAS: No acute abnormality. ADRENAL GLANDS: No acute abnormality. KIDNEYS, URETERS AND BLADDER: No stones in the kidneys or ureters. No hydronephrosis. No perinephric or periureteral stranding. No filling defects of the partially visualized collecting systems on delayed imaging. Urinary bladder is unremarkable. GI AND BOWEL: Stomach demonstrates no acute abnormality. No small or large bowel wall thickening or dilatation. Appendix unremarkable. There is no bowel obstruction. PERITONEUM AND RETROPERITONEUM: No ascites. No free air. VASCULATURE: Atherosclerotic plaque. Aorta is normal in caliber. LYMPH NODES: No lymphadenopathy. REPRODUCTIVE ORGANS: 2.7 cm left simple ovarian cyst. 3.9 cm right simple ovarian cyst. Status post hysterectomy. No solid adnexal mass. No complex-appearing adnexal mass. BONES AND SOFT TISSUES: No acute osseous abnormality. No focal soft tissue abnormality. IMPRESSION: 1. No acute findings. 2. Right simple-appearing ovarian cyst measuring 3.9 cm in a presumed postmenopausal patient; recommend follow-up pelvic ultrasound in 612 months. 3. Hepatomegaly. Electronically signed by: Morgane Naveau MD 02/10/2024 08:32 PM EST RP Workstation: [id number]      Procedures   Medications Ordered in the ED  ketorolac  (TORADOL ) 15 MG/ML injection 15 mg (15 mg Intravenous Given 02/10/24 1838)  iohexol  (OMNIPAQUE ) 300 MG/ML solution 100 mL (100 mLs Intravenous Contrast Given 02/10/24 272)    53 year old here for evaluation of lower abdominal pain going into her lower back and lower flanks for last 2 to 3 weeks.  Constant.  Described as aching.  No urinary symptoms.  No changes in bowel movements.  No blood in stool.  No abnormal vaginal bleeding she has had prior total abdominal hysterectomy.  She states she does not have ovaries or tubes.  No discharge, concern for STI.  Will plan on labs, imaging, reassess  Labs and imaging personally viewed interpreted CBC without leukocytosis Metabolic panel glucose 127 Lipase 17 UA negative for infection. does show trace blood CT AP bilateral ovarian cyst  Patient reassessed.  Discussed labs and imaging.  Patient was under the assumption that she did not have any ovaries.  Discussed bilateral ovarian cyst.  Will start on anti-inflammatory.  Will have her follow-up outpatient, return for new or worsening  symptoms.  Patient is nontoxic, nonseptic appearing, in no apparent distress.  Patient's pain and other symptoms adequately managed in emergency department.  Fluid bolus given.  Labs, imaging and vitals reviewed.  Patient does not meet the SIRS or Sepsis criteria.  On repeat exam patient does not have a surgical abdomin and there are no peritoneal signs.  No indication of appendicitis, bowel obstruction, bowel perforation, cholecystitis, diverticulitis, PID, intermittent, persistent torsion, TOA, ectopic pregnancy, AAA, dissection, traumatic injury.  Patient discharged home with symptomatic treatment and given strict instructions for follow-up with their primary care physician.  I have also discussed reasons to return immediately to the ER.  Patient expresses understanding and agrees with plan.                                    Medical Decision Making Amount and/or Complexity of Data Reviewed External Data Reviewed: labs, radiology and notes. Labs: ordered. Decision-making details documented in ED Course. Radiology: ordered and independent interpretation performed. Decision-making details documented in ED Course.  Risk OTC drugs. Prescription drug management. Decision regarding hospitalization. Diagnosis or treatment significantly limited by social determinants of health.       Final diagnoses:  Cysts of both ovaries    ED Discharge Orders          Ordered    meloxicam  (MOBIC ) 7.5 MG tablet  Daily        02/10/24 2058               Kavari Parrillo A, PA-C 02/10/24 2101    Jerrol Agent, MD 02/10/24 2138

## 2024-02-10 NOTE — ED Notes (Signed)
 D/c paperwork reviewed with pt, including prescriptions and follow up care.  All questions and/or concerns addressed at time of d/c.  No further needs expressed. . Pt verbalized understanding, Ambulatory without assistance to ED exit, NAD.

## 2024-02-10 NOTE — ED Triage Notes (Signed)
 Intermittent lower abd pain for 2 weeks. Radiates to BIL flank.  Denies NVD, urinary symptoms
